# Patient Record
Sex: Female | Born: 1970 | Race: White | Hispanic: No | Marital: Married | Smoking: Current every day smoker
Health system: Southern US, Community
[De-identification: ages and names within clinical notes are randomized; demographics above are authoritative.]

## PROBLEM LIST (undated history)

## (undated) DIAGNOSIS — F988 Other specified behavioral and emotional disorders with onset usually occurring in childhood and adolescence: Secondary | ICD-10-CM

## (undated) HISTORY — DX: Other specified behavioral and emotional disorders with onset usually occurring in childhood and adolescence: F98.8

---

## 2001-02-22 ENCOUNTER — Other Ambulatory Visit: Admission: RE | Admit: 2001-02-22 | Discharge: 2001-02-22 | Payer: Self-pay | Admitting: Obstetrics & Gynecology

## 2004-06-01 ENCOUNTER — Emergency Department (HOSPITAL_COMMUNITY): Admission: EM | Admit: 2004-06-01 | Discharge: 2004-06-01 | Payer: Self-pay | Admitting: Emergency Medicine

## 2005-03-02 ENCOUNTER — Ambulatory Visit (HOSPITAL_COMMUNITY): Admission: RE | Admit: 2005-03-02 | Discharge: 2005-03-02 | Payer: Self-pay | Admitting: *Deleted

## 2005-03-22 ENCOUNTER — Ambulatory Visit: Payer: Self-pay | Admitting: *Deleted

## 2005-03-22 ENCOUNTER — Ambulatory Visit (HOSPITAL_COMMUNITY): Admission: RE | Admit: 2005-03-22 | Discharge: 2005-03-22 | Payer: Self-pay | Admitting: *Deleted

## 2005-05-17 ENCOUNTER — Ambulatory Visit (HOSPITAL_COMMUNITY): Admission: RE | Admit: 2005-05-17 | Discharge: 2005-05-17 | Payer: Self-pay | Admitting: *Deleted

## 2005-07-20 ENCOUNTER — Ambulatory Visit (HOSPITAL_COMMUNITY): Admission: RE | Admit: 2005-07-20 | Discharge: 2005-07-20 | Payer: Self-pay | Admitting: *Deleted

## 2005-08-10 ENCOUNTER — Ambulatory Visit (HOSPITAL_COMMUNITY): Admission: RE | Admit: 2005-08-10 | Discharge: 2005-08-10 | Payer: Self-pay | Admitting: *Deleted

## 2005-09-01 ENCOUNTER — Ambulatory Visit: Payer: Self-pay | Admitting: *Deleted

## 2005-09-05 ENCOUNTER — Ambulatory Visit (HOSPITAL_COMMUNITY): Admission: RE | Admit: 2005-09-05 | Discharge: 2005-09-05 | Payer: Self-pay | Admitting: *Deleted

## 2005-09-06 ENCOUNTER — Ambulatory Visit: Payer: Self-pay | Admitting: Obstetrics and Gynecology

## 2005-09-06 ENCOUNTER — Inpatient Hospital Stay (HOSPITAL_COMMUNITY): Admission: AD | Admit: 2005-09-06 | Discharge: 2005-09-06 | Payer: Self-pay | Admitting: Obstetrics & Gynecology

## 2005-09-07 ENCOUNTER — Inpatient Hospital Stay (HOSPITAL_COMMUNITY): Admission: AD | Admit: 2005-09-07 | Discharge: 2005-09-09 | Payer: Self-pay | Admitting: Obstetrics & Gynecology

## 2005-09-07 ENCOUNTER — Ambulatory Visit: Payer: Self-pay | Admitting: Obstetrics and Gynecology

## 2005-09-07 ENCOUNTER — Encounter (INDEPENDENT_AMBULATORY_CARE_PROVIDER_SITE_OTHER): Payer: Self-pay | Admitting: Specialist

## 2006-09-07 ENCOUNTER — Ambulatory Visit (HOSPITAL_COMMUNITY): Admission: RE | Admit: 2006-09-07 | Discharge: 2006-09-07 | Payer: Self-pay | Admitting: Obstetrics & Gynecology

## 2006-11-22 ENCOUNTER — Ambulatory Visit: Payer: Self-pay | Admitting: Family Medicine

## 2006-11-22 ENCOUNTER — Inpatient Hospital Stay (HOSPITAL_COMMUNITY): Admission: AD | Admit: 2006-11-22 | Discharge: 2006-11-22 | Payer: Self-pay | Admitting: Obstetrics & Gynecology

## 2006-12-05 ENCOUNTER — Ambulatory Visit (HOSPITAL_COMMUNITY): Admission: RE | Admit: 2006-12-05 | Discharge: 2006-12-05 | Payer: Self-pay | Admitting: Gynecology

## 2007-02-06 ENCOUNTER — Inpatient Hospital Stay (HOSPITAL_COMMUNITY): Admission: RE | Admit: 2007-02-06 | Discharge: 2007-02-06 | Payer: Self-pay | Admitting: Obstetrics & Gynecology

## 2007-02-06 ENCOUNTER — Ambulatory Visit: Payer: Self-pay | Admitting: Obstetrics and Gynecology

## 2007-02-13 ENCOUNTER — Ambulatory Visit: Payer: Self-pay | Admitting: Obstetrics and Gynecology

## 2007-02-16 IMAGING — US US OB COMP LESS 14 WK
1 series · 18 of 28 positions shown · non-contrast
Comparison: none

CLINICAL DATA: Dating.  
 EARLY OBSTETRICAL ULTRASOUND:  
 Multiple images of the uterus and adnexa were obtained using a transabdominal approach.
 There is a single intrauterine pregnancy identified that demonstrates an estimated gestational age by ultrasound of 14 weeks and 4 days.  This is 3 weeks and 4 days ahead of expected estimated gestational age by LMP.  Fetal parameters correlate well with composite estimated gestational age today.  No signs of subchorionic hemorrhage are noted. An anterior placentation site is identified.  Limited anatomic evaluation was possible due to the early gestational age.  The following normal anatomy could be seen:  Symmetric choroid plexus, thalamus, stomach, bladder, and profile.  A full anatomic evaluation would be recommended in four weeks.  
 The right ovary was visualized measuring 3.1 x 2.5 x 1.9 cm and containing a corpus luteum cyst.  The left ovary could not be seen with confidence.  No signs of pelvic fluid are noted.

[Series 1: us ob comp less 14 wks · 18 of 46 slices shown]
[im 1/46]
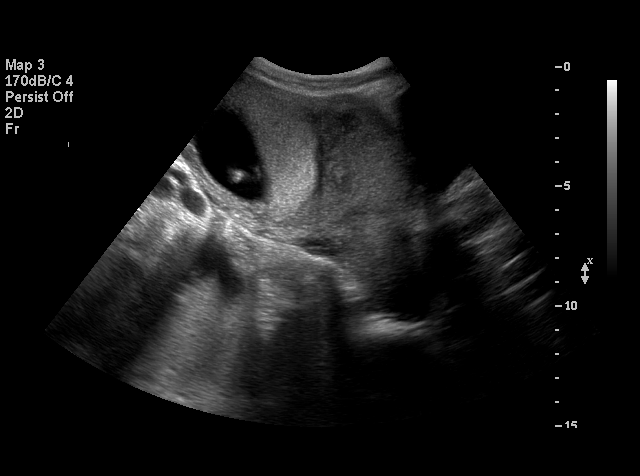
[im 4/46]
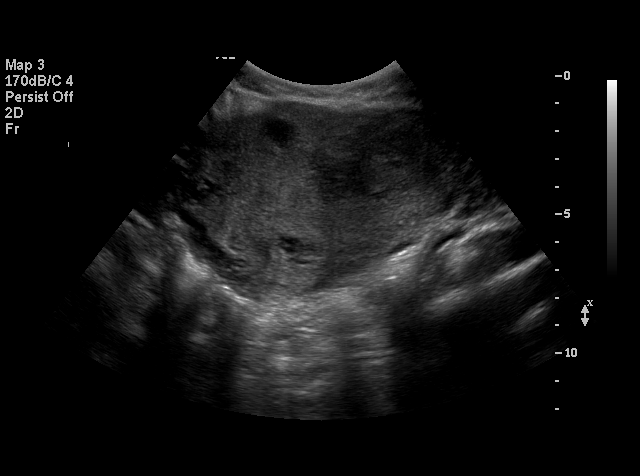
[im 6/46]
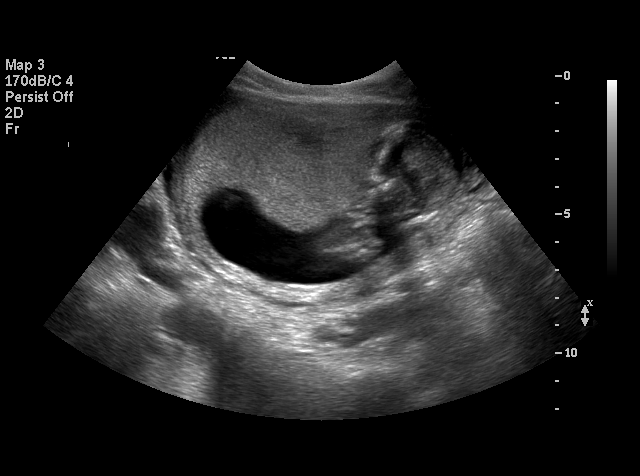
[im 9/46]
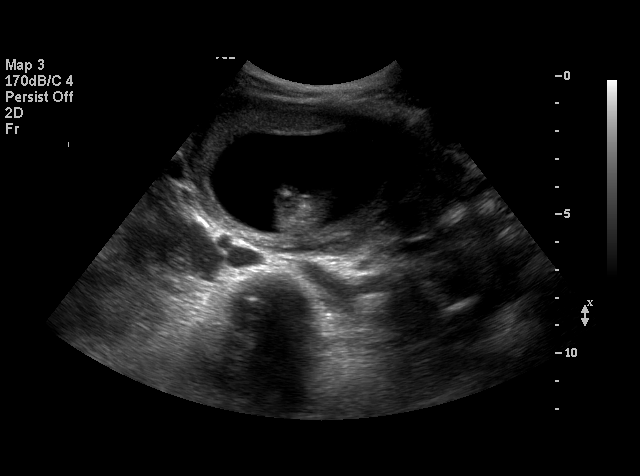
[im 12/46]
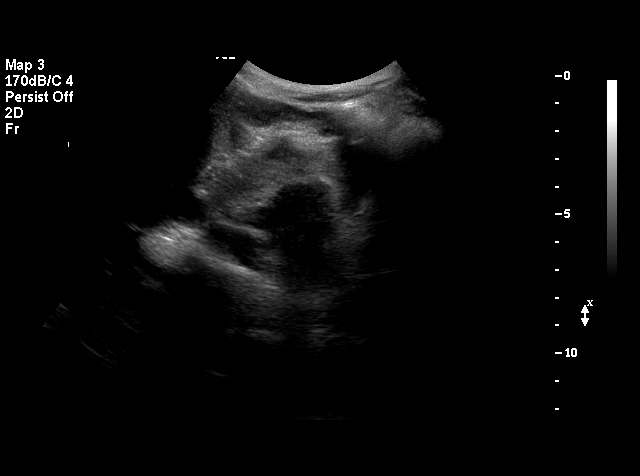
[im 14/46]
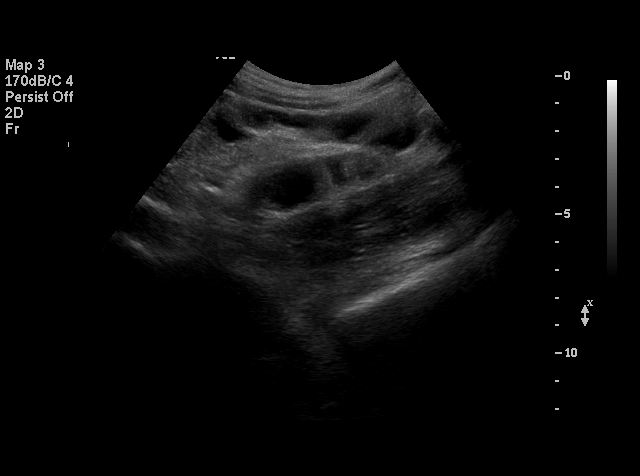
[im 17/46]
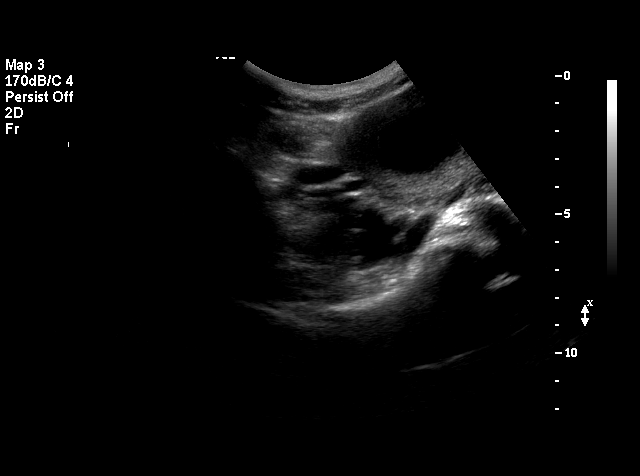
[im 19/46]
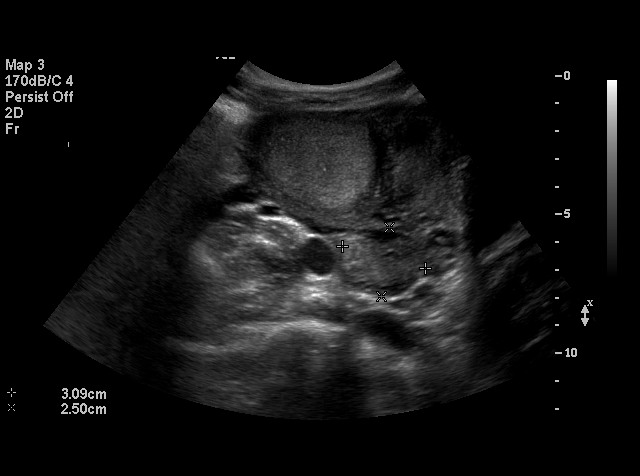
[im 22/46]
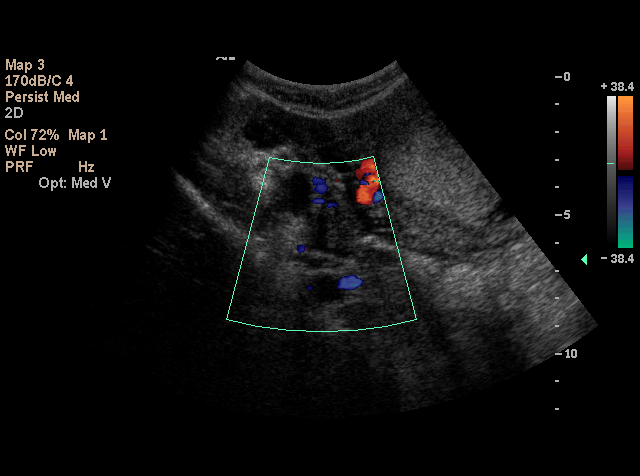
[im 24/46]
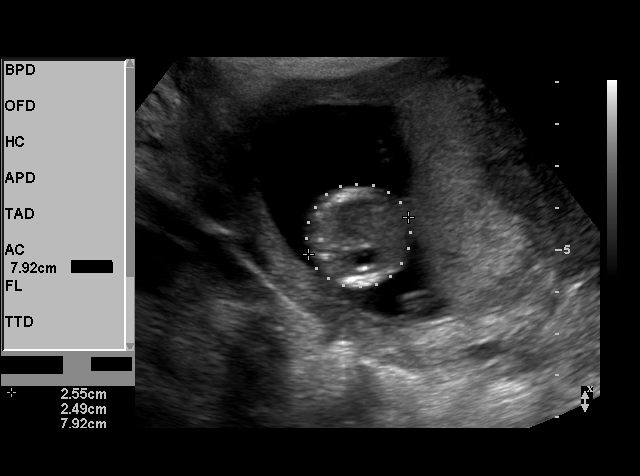
[im 27/46]
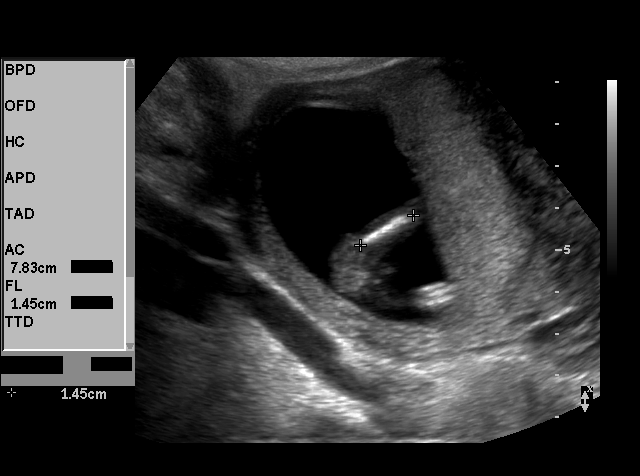
[im 29/46]
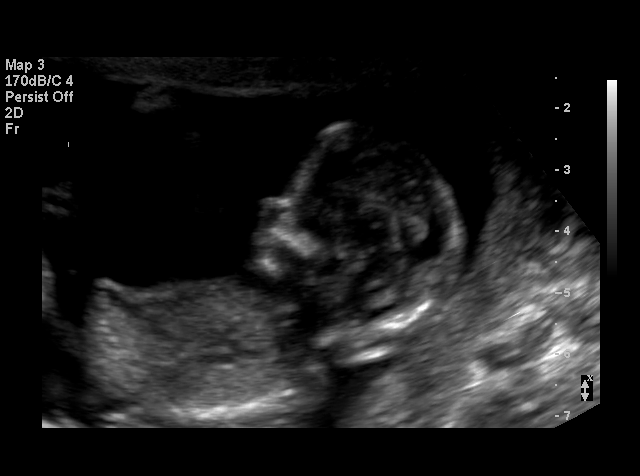
[im 32/46]
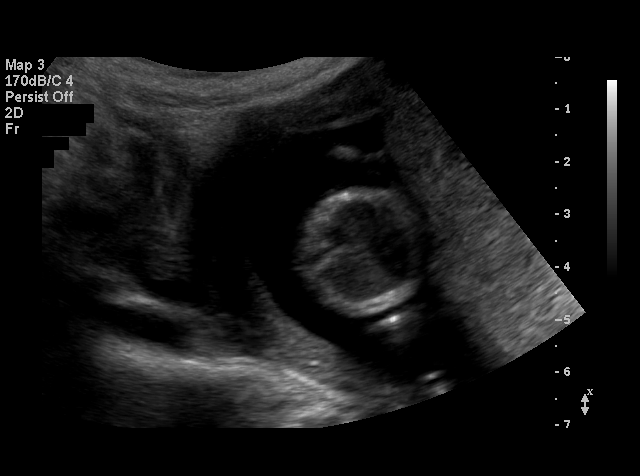
[im 36/46]
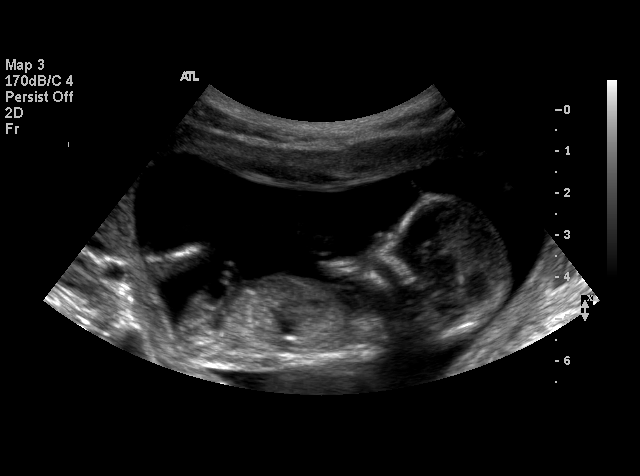
[im 37/46]
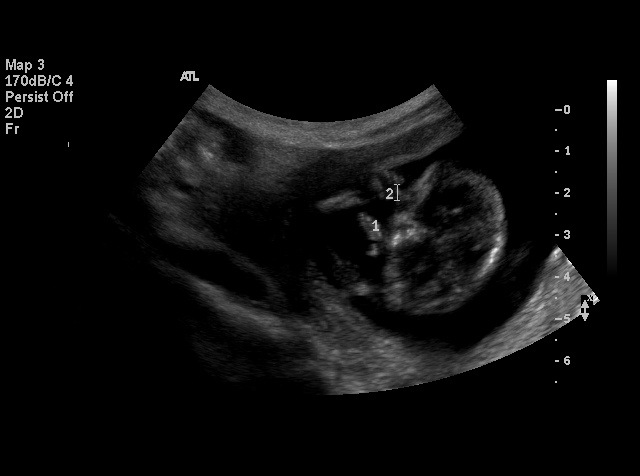
[im 41/46]
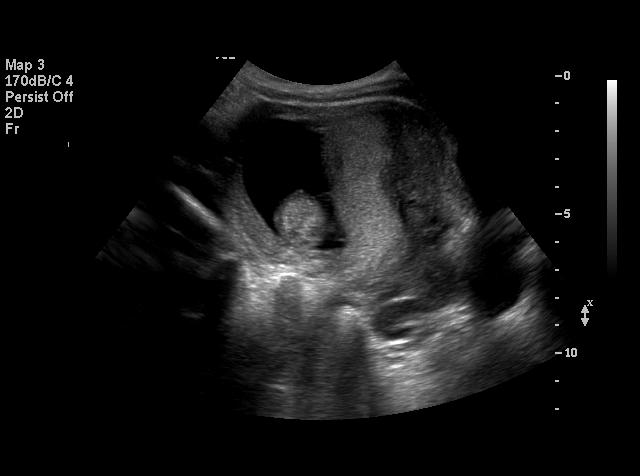
[im 42/46]
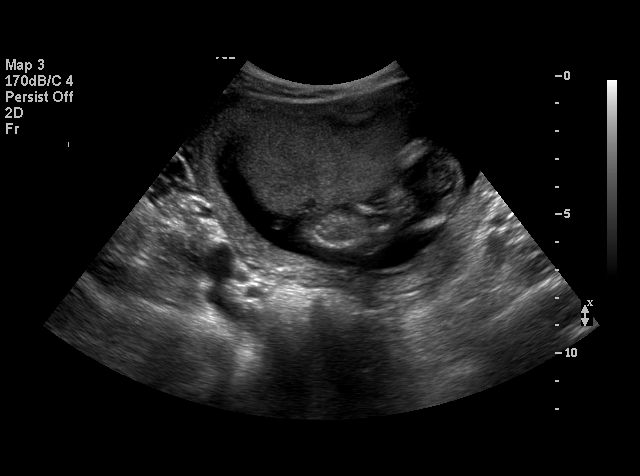
[im 46/46]
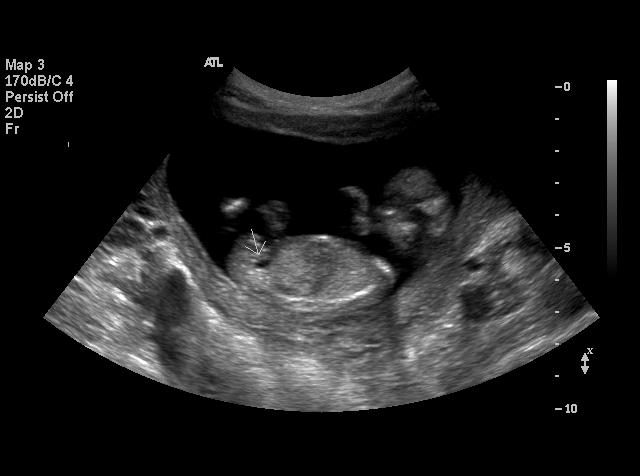

[18 of 28 positions shown; findings below may reference images not displayed]

IMPRESSION: 14 week 4 day living intrauterine pregnancy.

## 2007-02-18 ENCOUNTER — Ambulatory Visit: Payer: Self-pay | Admitting: Family Medicine

## 2007-02-18 ENCOUNTER — Inpatient Hospital Stay (HOSPITAL_COMMUNITY): Admission: AD | Admit: 2007-02-18 | Discharge: 2007-02-21 | Payer: Self-pay | Admitting: Obstetrics and Gynecology

## 2008-08-23 IMAGING — US US OB DETAIL+14 WK
1 series · 13 of 28 positions shown · non-contrast
Comparison: none

CLINICAL DATA: Assess dates and fetal anatomy.  Advanced maternal age.

[Series 1: us ob detail+14 wk · 0.18mm/px · 13 of 107 slices shown]
[im 4/107]
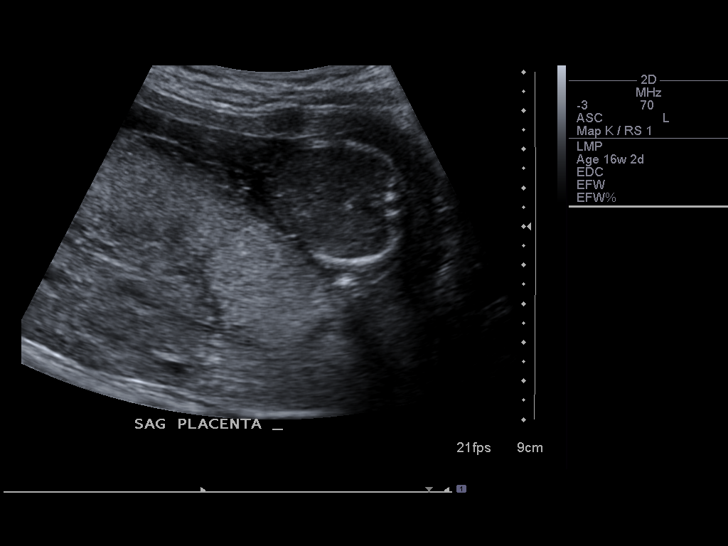
[im 12/107]
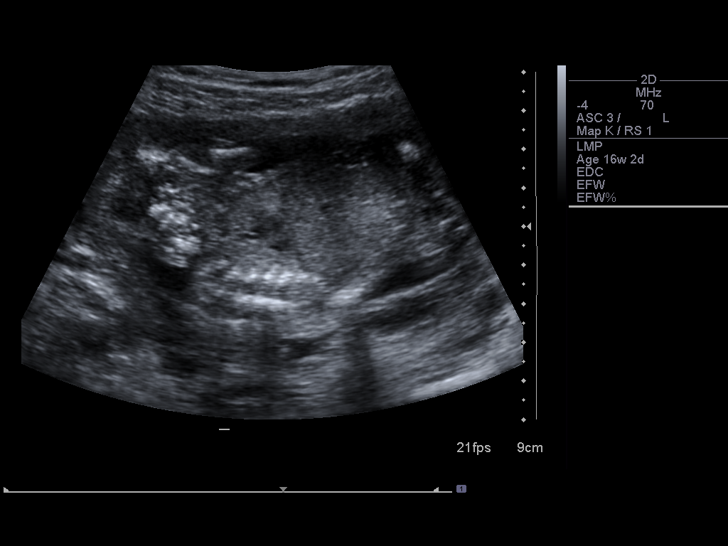
[im 20/107]
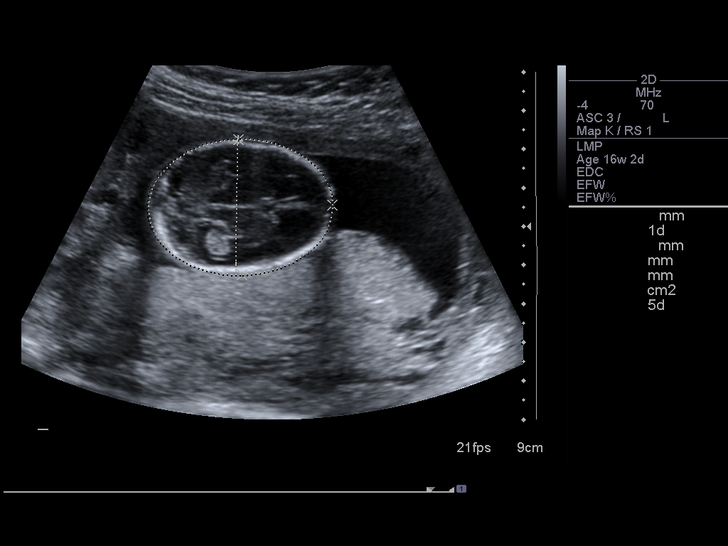
[im 28/107]
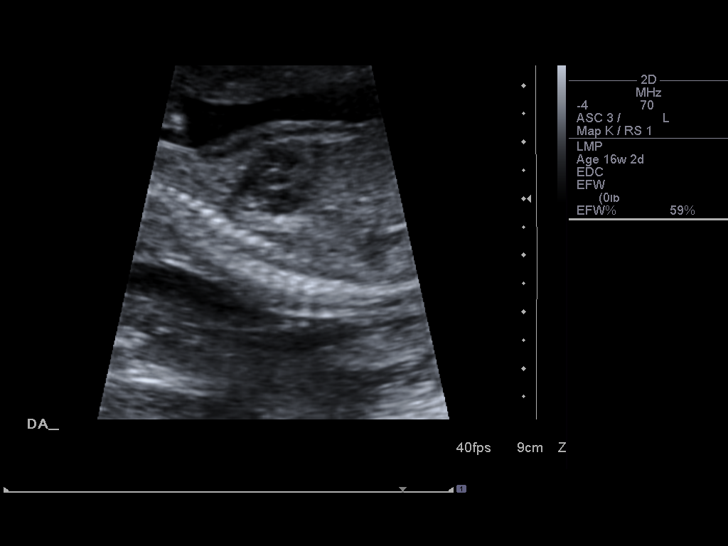
[im 36/107]
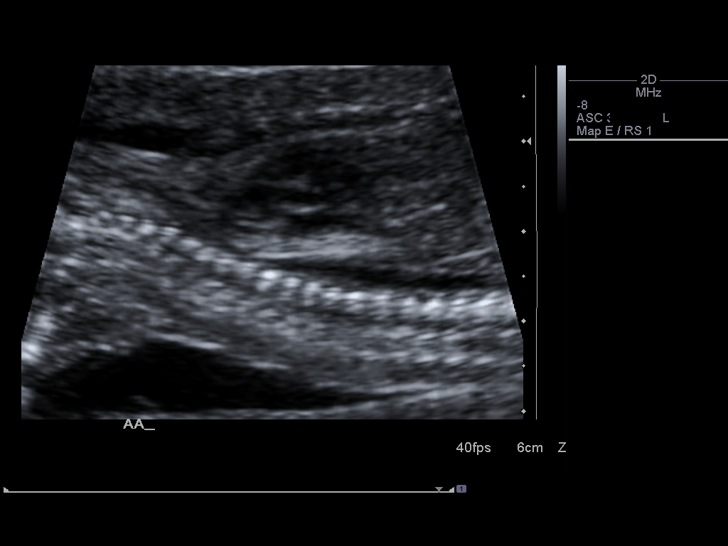
[im 44/107]
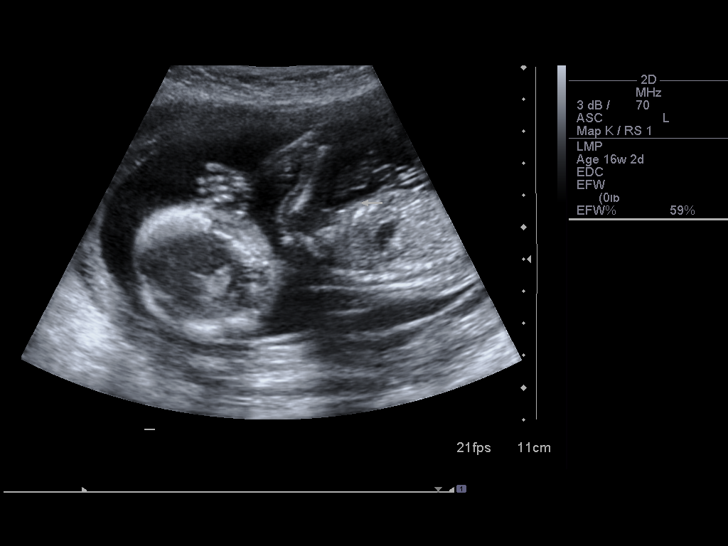
[im 55/107]
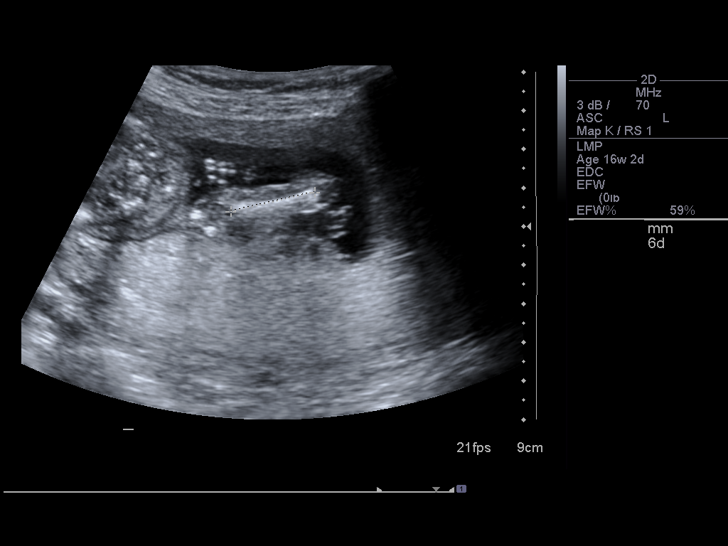
[im 63/107]
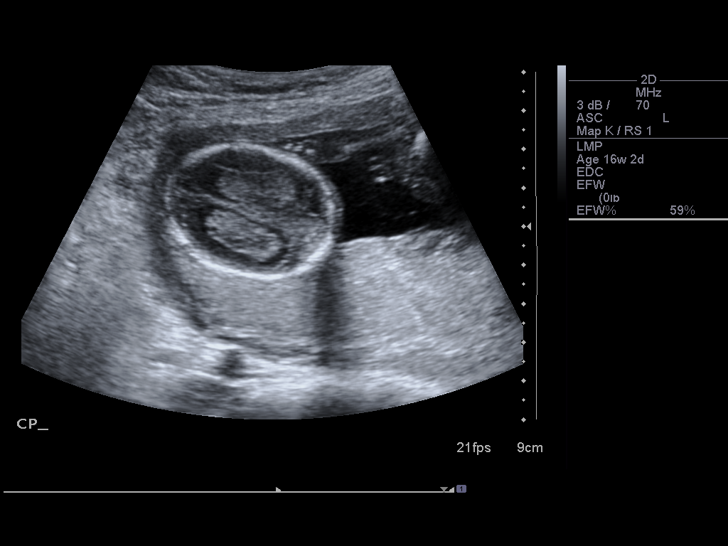
[im 71/107]
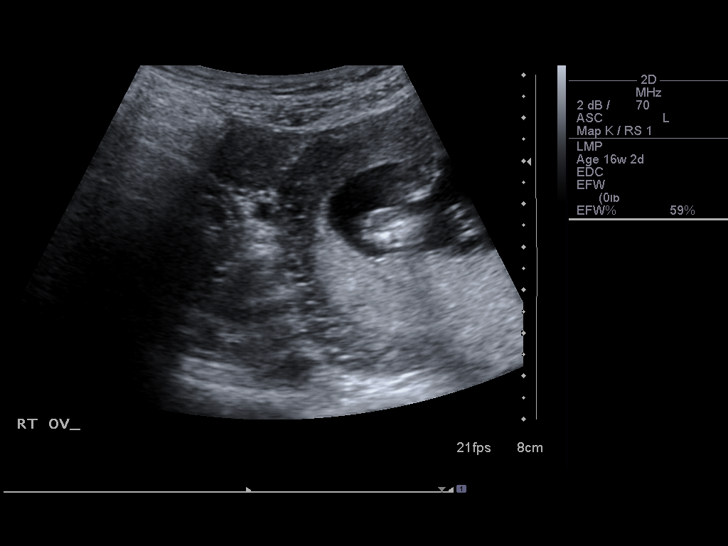
[im 79/107]
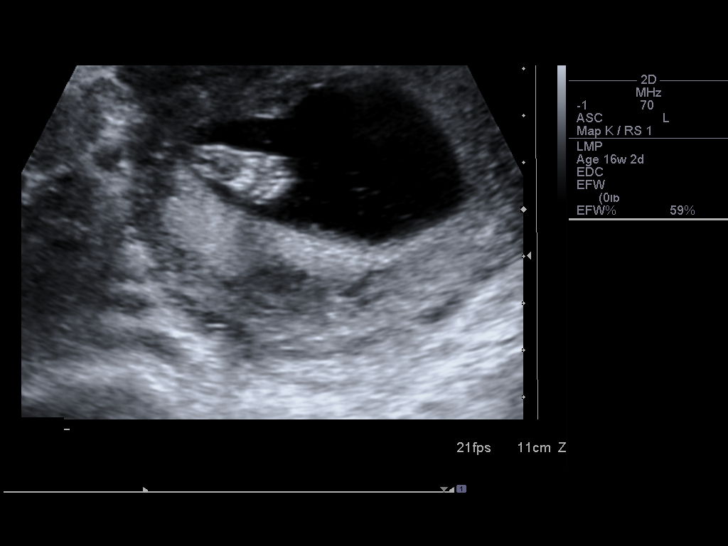
[im 87/107]
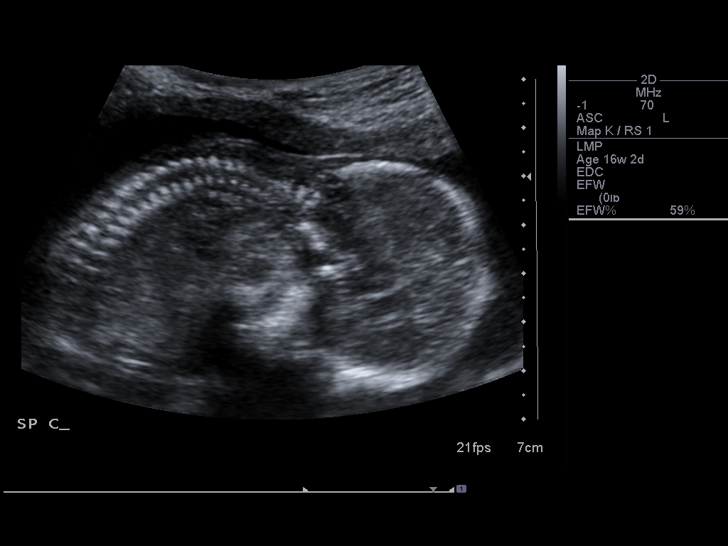
[im 95/107]
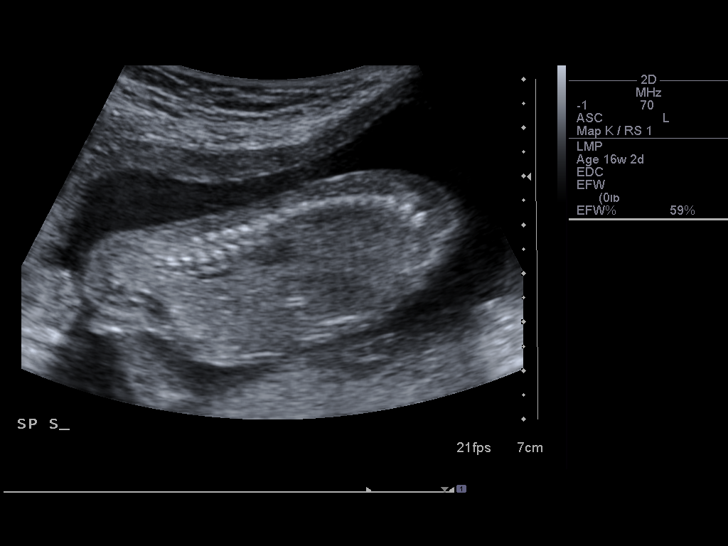
[im 103/107]
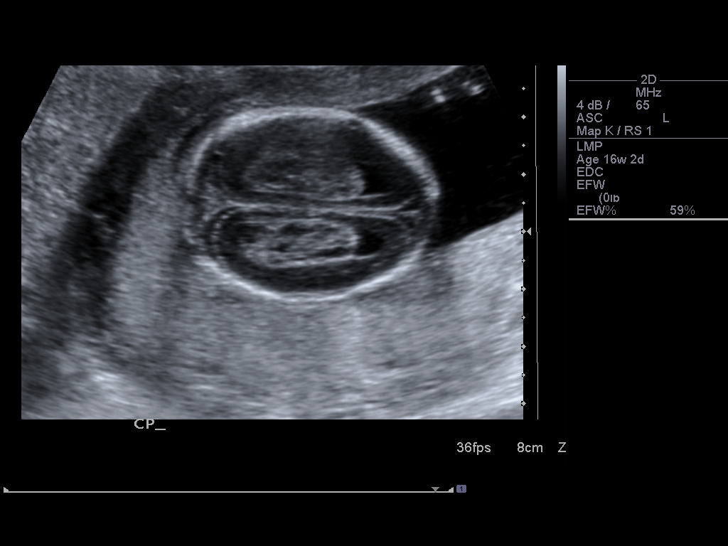

[13 of 28 positions shown; findings below may reference images not displayed]

DETAILED OBSTETRICAL ULTRASOUND:

Number of Fetuses:  1
Heart Rate:  144 bpm
Movement:  Yes
Breathing:  No
Presentation:  Transverse
Placental Location:  Posterior, low lying
Grade:  I
Previa:  No
Amniotic Fluid (Subjective):  Normal
Amniotic Fluid (Objective):  3.6 cm vertical pocket 

FETAL BIOMETRY
BPD:  3.3 cm   16 w 2 d 
HC:  13.0 cm   16 w 4 d 
AC:  11.2 cm   17 w 0 d 
FL:  2.0 cm   16 w 0 d 
HL:  2.2 cm   16 w 6 d 

MEAN GA:  16 w 4 d   US EDC:  02/18/07

FETAL ANATOMY
Lateral Ventricles:  Visualized 
Thalami/CSP:  Visualized 
Posterior Fossa:  Visualized 
Nuchal Region:  NF= 3.3 mm  Visualized 
Spine:  Visualized 
4 Chamber Heart on Left:  Visualized 
Stomach on Left:  Visualized 
3 Vessel Cord:  Visualized 
Cord Insertion site:  Visualized 
Kidneys:  Visualized 
Bladder:  Visualized 
Extremities:  Visualized 

ADDITIONAL ANATOMY VISUALIZED:  LVOT, RVOT, upper lip, orbits, profile, diaphragm, heel, 5th digit, ductal arch, and aortic arch.

MATERNAL UTERINE AND ADNEXAL FINDINGS
Cervix:  3.7 cm transabdominal.
IMPRESSION: 1.  Single intrauterine pregnancy demonstrating an estimated gestational age by ultrasound of 16 weeks 4 days.  Correlation with assigned gestational age by LMP of 16 weeks 2 days corresponds with appropriate growth.  
2.  No focal fetal abnormalities are noted with a good anatomic exam possible.   Specifically, no soft markers for Down syndrome were identified.  Given the patient?s age at birth of 35 and a pre-ultrasound odds-risk ratio of [DATE], today?s normal ultrasound would decrease the patient?s odds-risk ratio for Down syndrome to [DATE].  The patient states that she opted not to have the triple screen test performed.  
3.  Today?s scan results were discussed with the patient.

## 2008-11-20 IMAGING — US US OB LIMITED
1 series · 14 of 27 positions shown · non-contrast
Comparison: none

OBSTETRICAL ULTRASOUND:

 This ultrasound exam was performed in the [HOSPITAL] Ultrasound Department.  The OB US report was generated in the AS system, and faxed to the ordering physician.  This report is also available in [REDACTED] PACS.

[Series 1: us ob limited · 0.30mm/px · 14 of 27 slices shown]
[im 1/27]
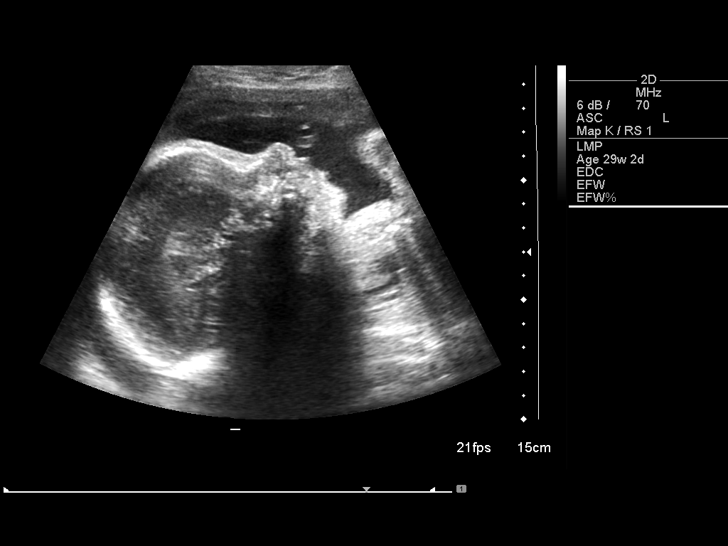
[im 3/27]
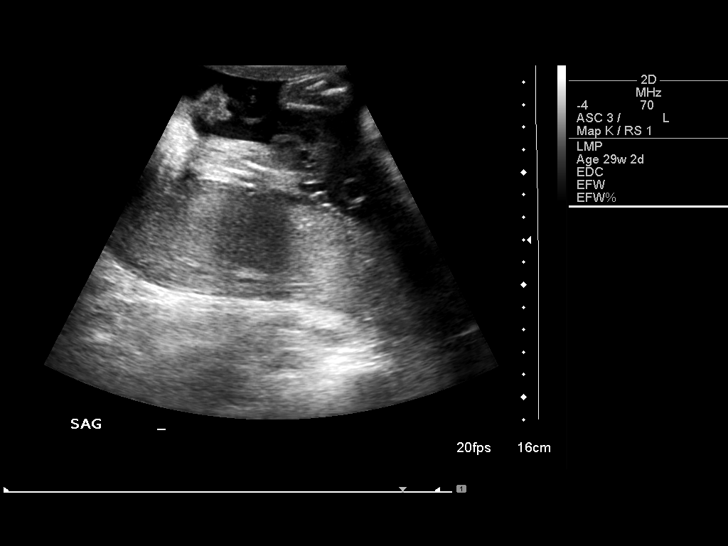
[im 5/27]
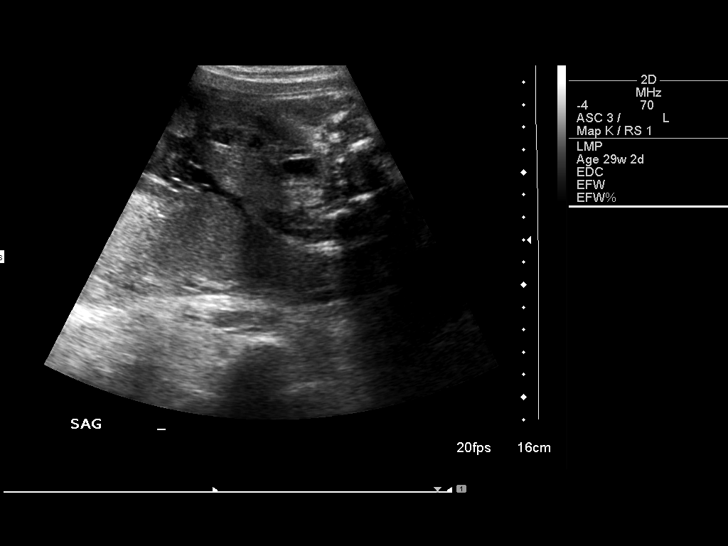
[im 7/27]
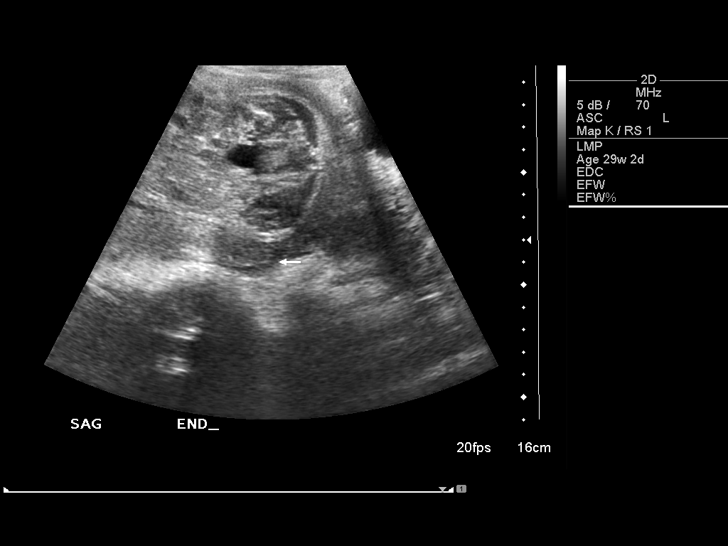
[im 9/27]
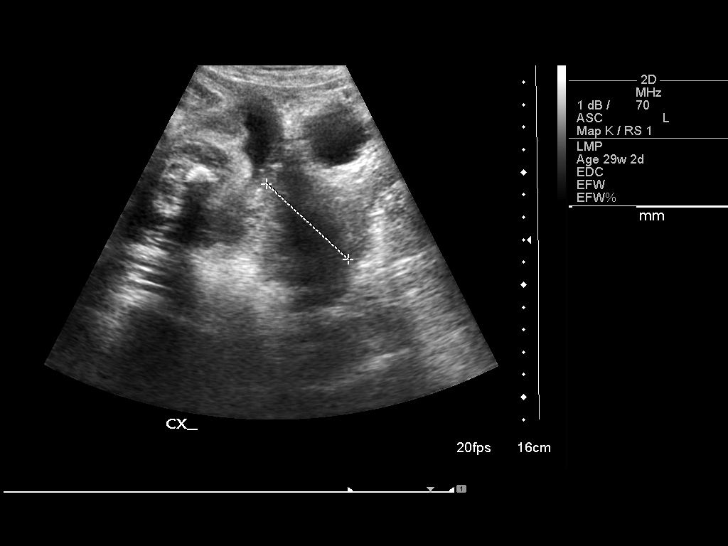
[im 11/27]
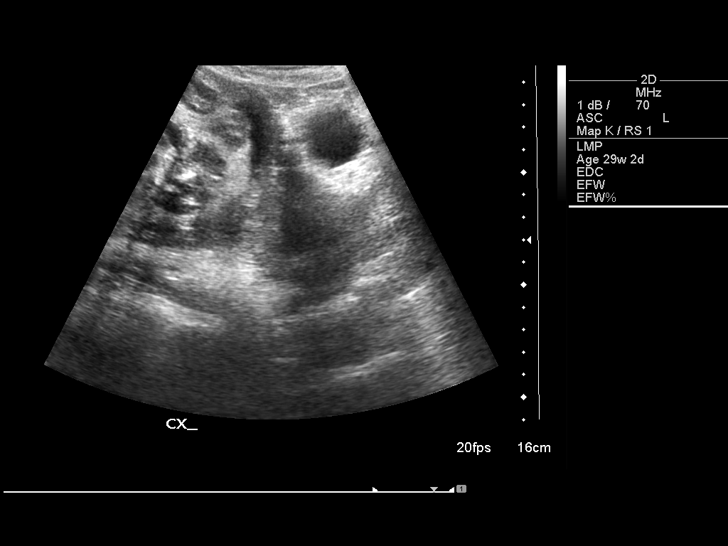
[im 13/27]
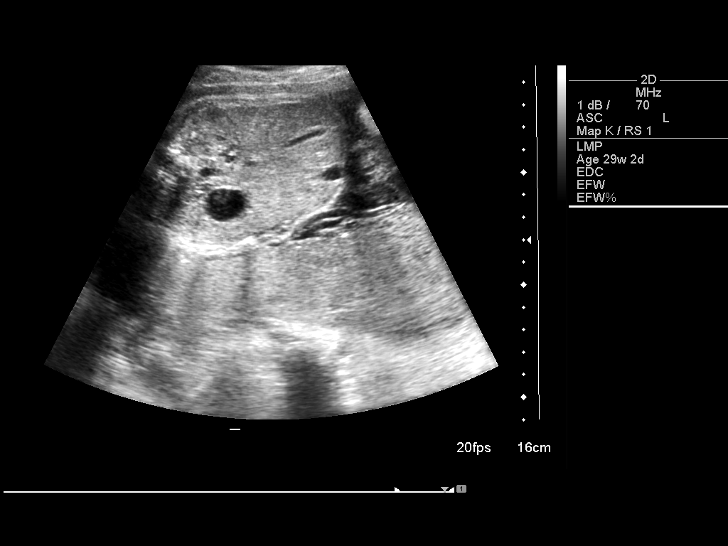
[im 15/27]
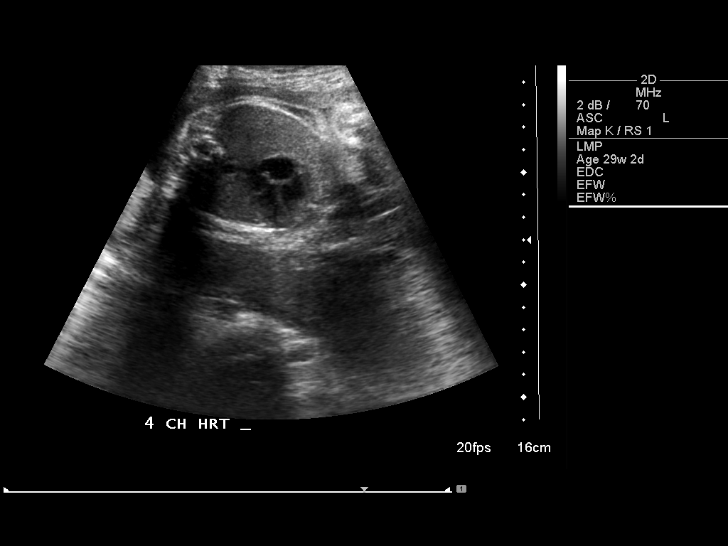
[im 17/27]
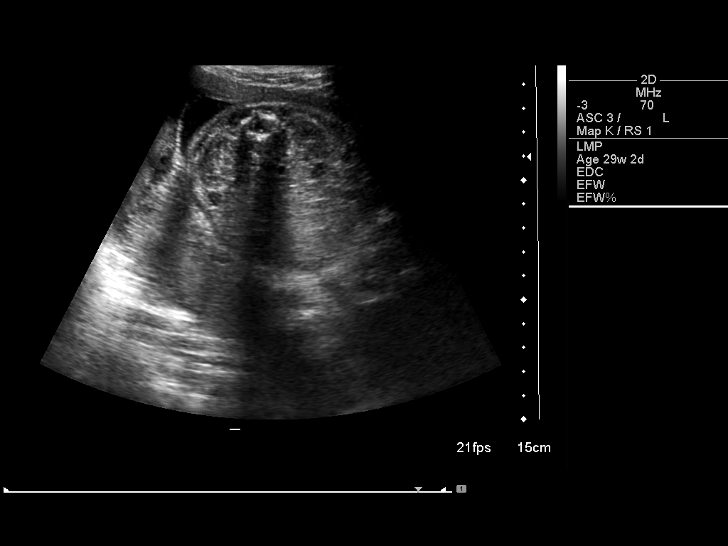
[im 19/27]
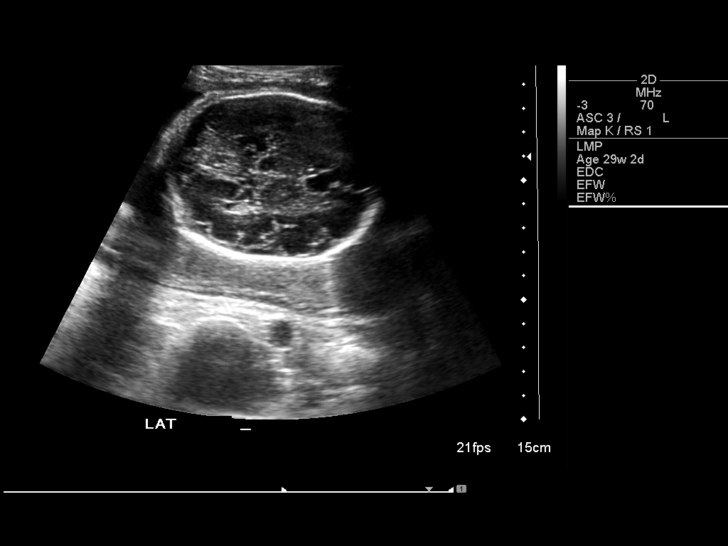
[im 21/27]
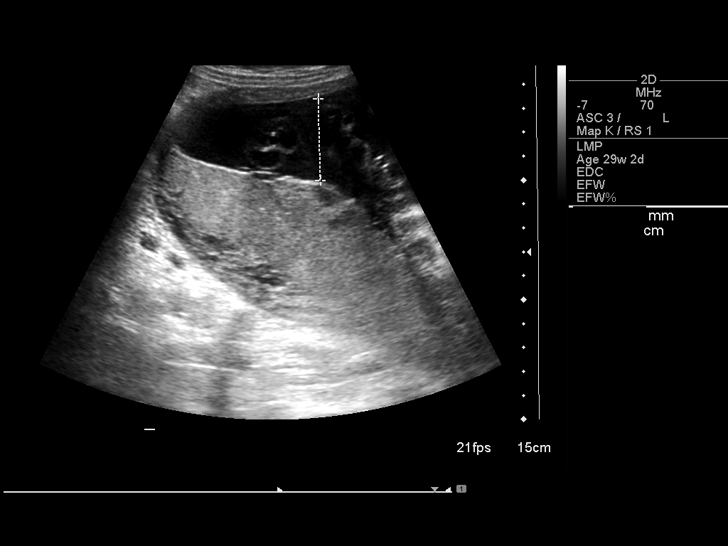
[im 23/27]
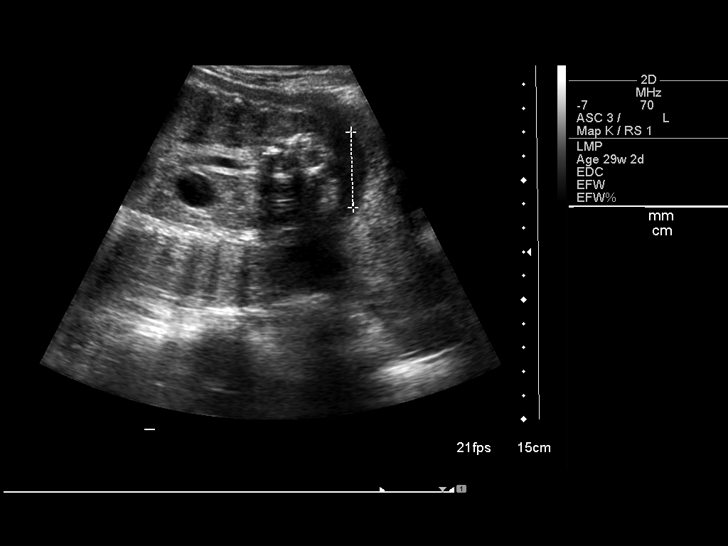
[im 25/27]
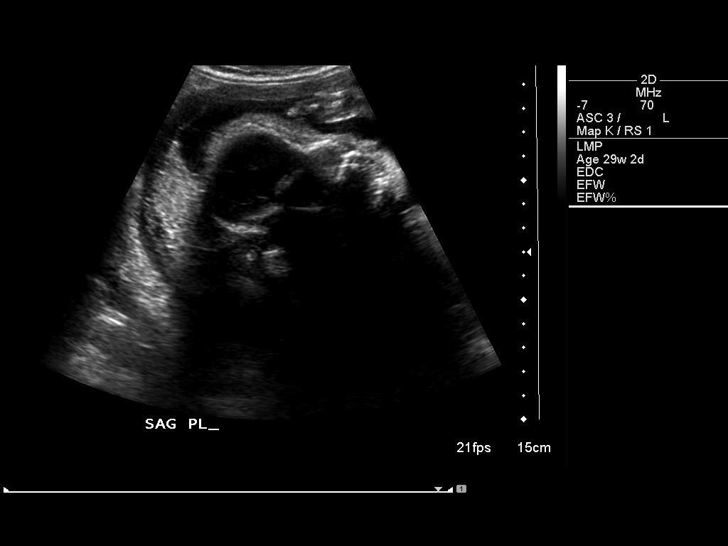
[im 27/27]
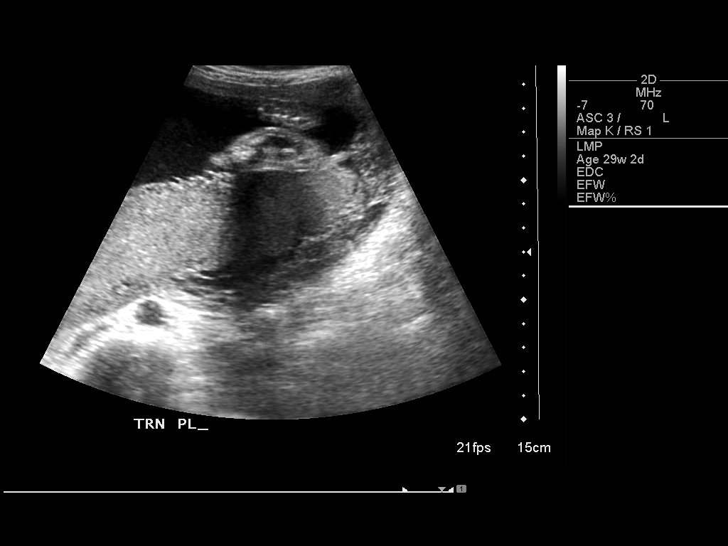

[14 of 27 positions shown; findings below may reference images not displayed]

IMPRESSION: See AS Obstetric US report.

## 2010-09-08 ENCOUNTER — Other Ambulatory Visit
Admission: RE | Admit: 2010-09-08 | Discharge: 2010-09-08 | Payer: Self-pay | Source: Home / Self Care | Admitting: Family Medicine

## 2010-09-08 ENCOUNTER — Ambulatory Visit: Payer: Self-pay | Admitting: Family Medicine

## 2010-09-08 DIAGNOSIS — F329 Major depressive disorder, single episode, unspecified: Secondary | ICD-10-CM

## 2010-09-08 DIAGNOSIS — F411 Generalized anxiety disorder: Secondary | ICD-10-CM | POA: Insufficient documentation

## 2010-09-08 LAB — CONVERTED CEMR LAB: Pap Smear: NEGATIVE

## 2010-09-10 ENCOUNTER — Telehealth: Payer: Self-pay | Admitting: Family Medicine

## 2010-09-24 ENCOUNTER — Ambulatory Visit: Payer: Self-pay | Admitting: Family Medicine

## 2010-09-24 DIAGNOSIS — F988 Other specified behavioral and emotional disorders with onset usually occurring in childhood and adolescence: Secondary | ICD-10-CM

## 2010-09-24 DIAGNOSIS — F17200 Nicotine dependence, unspecified, uncomplicated: Secondary | ICD-10-CM

## 2010-11-02 NOTE — Assessment & Plan Note (Signed)
Summary: BRAND NEW PT/TO REQ/REQ CPX/PT COMING IN FASTING/SELF PAY/CJR   Vital Signs:  Patient profile:   40 year old female Height:      64 inches Weight:      116.5 pounds BMI:     20.07 O2 Sat:      97 % on Room air Temp:     98.1 degrees F oral Pulse rate:   90 / minute BP sitting:   120 / 78  (left arm) Cuff size:   regular  Vitals Entered By: Kathlene November LPN (September 08, 2010 10:37 AM)  O2 Flow:  Room air CC: has anxiety and depression- gets angry easy, no motivation- has tried Trazadone and Zoloft 50mg  twice a day  Is Patient Diabetic? No   CC:  has anxiety and depression- gets angry easy and no motivation- has tried Trazadone and Zoloft 50mg  twice a day .  History of Present Illness: Brenda Lyons is a 40 year old female......Marland Kitchen married for the second time............ G3 P3 children are 47 year old and 67-year-old boy and a 25 1/2-year-old girl............ smoker 10 cigarettes a day..........., who comes in today as a new patient for evaluation of multiple issues.  She has a fairly long history of anxiety and depression of the last 10 to 15 years.  She's been going to [facility].  She has  been given Wellbutrin, Zoloft, and trazodone, none of which really helped.  She is originally from the [place], [place] area.  She did well in high school, but did not do well in community college.  She tended to bounce around.  She has some symptoms consistent with adult ADD.her mother and son also have been diagnosed with ADD  Penicillin Wellbutrin gave her a rash.  She drinks a beer every night after work........ 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  She's also had a skin rash, etiology unknown.  Her last dermatologist, Dr. Londell Moh, put on Aldactone 50 mg b.i.d.  She also has migraine headaches, which she's  aborts by getting in a quiet dark room and taking some Motrin.  She's never had a mammogram.  Her mother was diagnosed to have breast cancer in her  early 67s.  Tetanus today.   2011, which are today 2011  As noted above.  She is a G3, P3.  She does not have any periods because she has a Engineer, civil (consulting) IUD. .....was put in by the health department 3 1/2 years ago.  She smokes 10 cigarettes a day, and would like to quit.  In the, past.  She's tried cold [place], but it doesn't last long.  Her husband also smokes  Preventive Screening-Counseling & Management  Alcohol-Tobacco     Alcohol drinks/day: 1     Alcohol type: beer     Smoking Status: current     Packs/Day: 0.5  Caffeine-Diet-Exercise     Caffeine use/day: 4     Does Patient Exercise: yes     Times/week: 3      Drug Use:  no.    Current Medications (verified): 1)  Mirena 20 Mcg/24hr Iud (Levonorgestrel) 2)  Spironolactone 50 Mg Tabs (Spironolactone) .... Take One Tablet By Mouth Once A Day 3)  Multivitamins  Tabs (Multiple Vitamin) .... Take One Tablet By Mouth Once A Day  Allergies (verified): 1)  Pcn 2)  Wellbutrin  Past History:  Past medical, surgical, family and social histories (including risk factors) reviewed, and no changes noted (except as noted below).  Past Medical History: childbirth x  3 Anxiety Depression  Family History: Reviewed history and no changes required. father 90, alive and well mother 25.  History breast cancer in her early 65s had surgery.  Postoperatively,  doing well.  No recurrence.  She does not know anything about her maternal grandmother.  One brother one sister both in good health  Social History: Reviewed history and no changes required. Married Current Smoker Alcohol use-yes Drug use-no Regular exercise-yes Smoking Status:  current Packs/Day:  0.5 Caffeine use/day:  4 Does Patient Exercise:  yes Drug Use:  no  Review of Systems      See HPI  Physical Exam  General:  Well-developed,well-nourished,in no acute distress; alert,appropriate and cooperative throughout examination Head:  Normocephalic and atraumatic without obvious abnormalities. No  apparent alopecia or balding. Eyes:  No corneal or conjunctival inflammation noted. EOMI. Perrla. Funduscopic exam benign, without hemorrhages, exudates or papilledema. Vision grossly normal. Ears:  External ear exam shows no significant lesions or deformities.  Otoscopic examination reveals clear canals, tympanic membranes are intact bilaterally without bulging, retraction, inflammation or discharge. Hearing is grossly normal bilaterally. Nose:  External nasal examination shows no deformity or inflammation. Nasal mucosa are pink and moist without lesions or exudates. Mouth:  Oral mucosa and oropharynx without lesions or exudates.  Teeth in good repair. Neck:  No deformities, masses, or tenderness noted. Chest Wall:  No deformities, masses, or tenderness noted. Breasts:  No mass, nodules, thickening, tenderness, bulging, retraction, inflamation, nipple discharge or skin changes noted.   Lungs:  Normal respiratory effort, chest expands symmetrically. Lungs are clear to auscultation, no crackles or wheezes. Heart:  Normal rate and regular rhythm. S1 and S2 normal without gallop, murmur, click, rub or other extra sounds. Abdomen:  Bowel sounds positive,abdomen soft and non-tender without masses, organomegaly or hernias noted. Genitalia:  Pelvic Exam:        External: normal female genitalia without lesions or masses        Vagina: normal without lesions or masses        Cervix: normal without lesions or masses        Adnexa: normal bimanual exam without masses or fullness        Uterus: normal by palpation        Pap smear: not performed Msk:  No deformity or scoliosis noted of thoracic or lumbar spine.   Pulses:  R and L carotid,radial,femoral,dorsalis pedis and posterior tibial pulses are full and equal bilaterally Extremities:  No clubbing, cyanosis, edema, or deformity noted with normal full range of motion of all joints.   Neurologic:  No cranial nerve deficits noted. Station and gait are  normal. Plantar reflexes are down-going bilaterally. DTRs are symmetrical throughout. Sensory, motor and coordinative functions appear intact. Skin:  total body skin exam normal except for multiple postures or lesions on her back, face, arms, and legs Cervical Nodes:  No lymphadenopathy noted Axillary Nodes:  No palpable lymphadenopathy Inguinal Nodes:  No significant adenopathy Psych:  Cognition and judgment appear intact. Alert and cooperative with normal attention span and concentration. No apparent delusions, illusions, hallucinations   Problems:  Medical Problems Added: 1)  Dx of Depression  (ICD-311) 2)  Dx of Anxiety  (ICD-300.00) 3)  Dx of Routine General Medical Exam@health  Care Facl  (ICD-V70.0) 4)  Dx of Depressive Disorder  (ICD-311) 5)  Dx of Rash and Other Nonspecific Skin Eruption  (ICD-782.1)  Impression & Recommendations:  Problem # 1:  Preventive Health Care (ICD-V70.0) Assessment New  Problem #  2:  DEPRESSIVE DISORDER (ICD-311) Assessment: New  Problem # 3:  RASH AND OTHER NONSPECIFIC SKIN ERUPTION (ICD-782.1) Assessment: New  Complete Medication List: 1)  Mirena 20 Mcg/24hr Iud (Levonorgestrel) 2)  Spironolactone 50 Mg Tabs (Spironolactone) .... Take one tablet by mouth once a day 3)  Multivitamins Tabs (Multiple vitamin) .... Take one tablet by mouth once a day 4)  Methylphenidate Hcl 10 Mg Tabs (Methylphenidate hcl) .... Take 1 tablet by mouth two times a day 5)  Chantix Continuing Month Pak 1 Mg Tabs (Varenicline tartrate) .... Uad  Other Orders: Tdap => 58yrs IM (16109) Admin 1st Vaccine (60454) Flu Vaccine 76yrs + (09811) Admin of Any Addtl Vaccine (91478)  Patient Instructions: 1)  Ritalin 10 mg twice daily.  Follow-up in two weeks. 2)  I will call you dermatologist Dr. Terri Piedra to try to find out a diagnosis so we can help heal the skin lesions. 3)  Chantix one half tab q.a.m. and begin tapering..........Marland Kitchen 8 for a week......... 6 for a week.......Marland Kitchen  etc. etc. 4)  Schedule your mammogram. Prescriptions: SPIRONOLACTONE 50 MG TABS (SPIRONOLACTONE) Take one tablet by mouth once a day  #100 x 6   Entered and Authorized by:   Roderick Pee MD   Signed by:   Roderick Pee MD on 09/08/2010   Method used:   Print then Give to Patient   RxID:   [id number] CHANTIX CONTINUING MONTH PAK 1 MG TABS (VARENICLINE TARTRATE) UAD  #1 x 2   Entered and Authorized by:   Roderick Pee MD   Signed by:   Roderick Pee MD on 09/08/2010   Method used:   Print then Give to Patient   RxID:   [id number] METHYLPHENIDATE HCL 10 MG TABS (METHYLPHENIDATE HCL) Take 1 tablet by mouth two times a day  #60 x 0   Entered and Authorized by:   Roderick Pee MD   Signed by:   Roderick Pee MD on 09/08/2010   Method used:   Print then Give to Patient   RxID:   [id number]    Orders Added: 1)  Tdap => 39yrs IM [56387] 2)  Admin 1st Vaccine [90471] 3)  Flu Vaccine 69yrs + [56433] 4)  Admin of Any Addtl Vaccine [90472] 5)  New Patient Level II [29518]   Immunizations Administered:  Tetanus Vaccine:    Vaccine Type: Tdap    Site: left deltoid    Mfr: GlaxoSmithKline    Dose: 0.5 ml    Route: IM    Given by: Kathlene November LPN    Exp. Date: 07/22/2012    Lot #: [id number]    VIS given: 08/20/08 version given September 08, 2010.  Influenza Vaccine # 1:    Vaccine Type: Fluvax 3+    Site: right deltoid    Mfr: GlaxoSmithKline    Dose: 0.5 ml    Route: IM    Given by: Kathlene November LPN    Exp. Date: 04/02/2011    Lot #: [id number]    VIS given: 04/27/10 version given September 08, 2010.   Immunizations Administered:  Tetanus Vaccine:    Vaccine Type: Tdap    Site: left deltoid    Mfr: GlaxoSmithKline    Dose: 0.5 ml    Route: IM    Given by: Kathlene November LPN    Exp. Date: 07/22/2012    Lot #: [id number]    VIS given: 08/20/08 version given September 08, 2010.  Influenza  Vaccine # 1:    Vaccine Type: Fluvax 3+    Site: right  deltoid    Mfr: GlaxoSmithKline    Dose: 0.5 ml    Route: IM    Given by: Kathlene November LPN    Exp. Date: 04/02/2011    Lot #: [id number]    VIS given: 04/27/10 version given September 08, 2010.

## 2010-11-02 NOTE — Progress Notes (Signed)
  Phone Note Outgoing Call   Summary of Call: I called [name]........... Pap was normal.  There was some Candida however, she is asymptomatic.  Advised no treatment since she is asymptomatic, however, if she does become symptomatic.  Advise Monistat 7 Initial call taken by: Roderick Pee MD,  September 10, 2010 9:40 AM

## 2010-11-04 NOTE — Assessment & Plan Note (Signed)
Summary: 2 wk rov/njr   Vital Signs:  Patient profile:   40 year old female Weight:      116 pounds Temp:     98.1 degrees F oral BP sitting:   102 / 78  (left arm) Cuff size:   regular  Vitals Entered By: Kern Reap CMA Duncan Dull) (September 24, 2010 11:23 AM)  History of Present Illness: Brenda Lyons is a 40 year old female, G3, P3, smoker, who comes in today for follow-up of ADD and tobacco abuse.  We gave her a prescription for the chantix, which she never got filled.  I again encouraged her to begin the chantix program.  She is taking her Ritalin 10 mg b.i.d. and has noticed a great improvement in her focus and concentration.  She states her husband, who is a Music therapist has ADHD, and they are having a lot of marital problems.  She went to counseling last year, but he will go with her  We are still unable to get a copy of her dermatologic reports.  However, she states that she had another name when she went to Dr. Dorita Sciara office.  She will try to get those reports for [place]  Allergies: 1)  Pcn 2)  Wellbutrin  Past History:  Past medical, surgical, family and social histories (including risk factors) reviewed for relevance to current acute and chronic problems.  Past Medical History: Reviewed history from 09/08/2010 and no changes required. childbirth x 3 Anxiety Depression  Family History: Reviewed history from 09/08/2010 and no changes required. father 6, alive and well mother 19.  History breast cancer in her early 83s had surgery.  Postoperatively,  doing well.  No recurrence.  She does not know anything about her maternal grandmother.  One brother one sister both in good health  Social History: Reviewed history from 09/08/2010 and no changes required. Married Current Smoker Alcohol use-yes Drug use-no Regular exercise-yes  Review of Systems      See HPI  Physical Exam  General:  Well-developed,well-nourished,in no acute distress; alert,appropriate and  cooperative throughout examination   Problems:  Medical Problems Added: 1)  Dx of Tobacco Use  (ICD-305.1) 2)  Dx of Attention Deficit Disorder, Adult  (ICD-314.00)  Impression & Recommendations:  Problem # 1:  ATTENTION DEFICIT DISORDER, ADULT (ICD-314.00) Assessment Improved  Problem # 2:  RASH AND OTHER NONSPECIFIC SKIN ERUPTION (ICD-782.1) Assessment: Unchanged  Problem # 3:  TOBACCO USE (ICD-305.1) Assessment: Unchanged  Her updated medication list for this problem includes:    Chantix Continuing Month Pak 1 Mg Tabs (Varenicline tartrate) ..... Uad  Complete Medication List: 1)  Mirena 20 Mcg/24hr Iud (Levonorgestrel) 2)  Spironolactone 50 Mg Tabs (Spironolactone) .... Take one tablet by mouth once a day 3)  Multivitamins Tabs (Multiple vitamin) .... Take one tablet by mouth once a day 4)  Methylphenidate Hcl 10 Mg Tabs (Methylphenidate hcl) .... Take 1 tablet by mouth 3  times a day 5)  Chantix Continuing Month Pak 1 Mg Tabs (Varenicline tartrate) .... Uad  Patient Instructions: 1)  seriously consider trying the chantix.......... one half tab q.a.m. and taper the cigarettes as was outlined. 2)  Increase the Ritalin to 10 mg 3 times a day. 3)  Have Dr. Dorita Sciara office fax me a copy of all the dermatologic data and I will call you after the new year to discuss those findings. Prescriptions: METHYLPHENIDATE HCL 10 MG TABS (METHYLPHENIDATE HCL) Take 1 tablet by mouth 3  times a day  #100 x 0  Entered and Authorized by:   Roderick Pee MD   Signed by:   Roderick Pee MD on 09/24/2010   Method used:   Print then Give to Patient   RxID:   [id number] METHYLPHENIDATE HCL 10 MG TABS (METHYLPHENIDATE HCL) Take 1 tablet by mouth 3  times a day  #100 x 0   Entered and Authorized by:   Roderick Pee MD   Signed by:   Roderick Pee MD on 09/24/2010   Method used:   Print then Give to Patient   RxID:   [id number] METHYLPHENIDATE HCL 10 MG TABS (METHYLPHENIDATE HCL)  Take 1 tablet by mouth 3  times a day  #100 x 0   Entered and Authorized by:   Roderick Pee MD   Signed by:   Roderick Pee MD on 09/24/2010   Method used:   Print then Give to Patient   RxID:   [id number]    Orders Added: 1)  New Patient Level II [30160]

## 2010-11-22 ENCOUNTER — Telehealth: Payer: Self-pay | Admitting: Family Medicine

## 2010-11-22 DIAGNOSIS — F988 Other specified behavioral and emotional disorders with onset usually occurring in childhood and adolescence: Secondary | ICD-10-CM

## 2010-11-22 MED ORDER — METHYLPHENIDATE HCL ER (LA) 10 MG PO CP24: 10.0000 mg | ORAL_CAPSULE | Freq: Three times a day (TID) | ORAL | Status: DC

## 2010-11-22 NOTE — Telephone Encounter (Signed)
Refill  Ritalin

## 2010-11-22 NOTE — Telephone Encounter (Signed)
rx ready for pick up. Left message on machine for patient. 

## 2010-11-24 ENCOUNTER — Telehealth: Payer: Self-pay | Admitting: *Deleted

## 2010-11-24 NOTE — Telephone Encounter (Signed)
[facility] Triage Call Report Triage Record Num: [id number] Operator: Tomasita Crumble Patient Name: Brenda Lyons Call Date & Time: 11/23/2010 5:41:08PM Patient Phone: [phone] PCP: Patient Gender: Female PCP Fax : Patient DOB: September 09, 1971 [facility] Name: [facility] Reason for Call: Hermenia Bers, called from [facility] [phone]. Pharmacy states Dr. Tawanna Cooler wrote Ritalin LA tid; normally daily. Her normal Rx. is Ritalin (not LA). Pharmacy states they will require new Rx. anyway; patient to follow up with office on 2/22. Protocol(s) Used: Medication Question Calls, No Triage (Adults) Recommended Outcome per Protocol: Call Provider within 24 Hours Reason for Outcome: Pharmacy calling with prescription question and triager unable to answer question Care Advice: ~ 11/23/2010 5:53:13PM Page 1 of 1 [email or web address]

## 2010-11-24 NOTE — Telephone Encounter (Signed)
Left message on machine for patient  To return our call 

## 2010-11-24 NOTE — Telephone Encounter (Signed)
Pt had picked up a rx for Ritalin. She took rx to the pharmacy and was told that the rx wasn't what she normally takes. She would like to know which strength she is supposed to be on, the short acting or long acting? Please return her call. Thanks.

## 2010-11-25 ENCOUNTER — Other Ambulatory Visit: Payer: Self-pay | Admitting: *Deleted

## 2010-11-25 DIAGNOSIS — F988 Other specified behavioral and emotional disorders with onset usually occurring in childhood and adolescence: Secondary | ICD-10-CM

## 2010-11-25 MED ORDER — METHYLPHENIDATE HCL 10 MG PO TABS: 10.0000 mg | ORAL_TABLET | Freq: Three times a day (TID) | ORAL | Status: DC

## 2010-11-30 ENCOUNTER — Ambulatory Visit (INDEPENDENT_AMBULATORY_CARE_PROVIDER_SITE_OTHER): Payer: Self-pay | Admitting: Family Medicine

## 2010-11-30 ENCOUNTER — Encounter: Payer: Self-pay | Admitting: Family Medicine

## 2010-11-30 VITALS — BP 98/64 | Temp 98.2°F | Ht 64.0 in | Wt 117.0 lb

## 2010-11-30 DIAGNOSIS — F988 Other specified behavioral and emotional disorders with onset usually occurring in childhood and adolescence: Secondary | ICD-10-CM

## 2010-11-30 DIAGNOSIS — L281 Prurigo nodularis: Secondary | ICD-10-CM

## 2010-11-30 DIAGNOSIS — L280 Lichen simplex chronicus: Secondary | ICD-10-CM

## 2010-11-30 MED ORDER — METHYLPHENIDATE HCL 10 MG PO TABS: ORAL_TABLET | ORAL | Status: DC

## 2010-11-30 MED ORDER — METHYLPHENIDATE HCL 10 MG PO TABS: 10.0000 mg | ORAL_TABLET | Freq: Every day | ORAL | Status: DC

## 2010-11-30 MED ORDER — METHYLPHENIDATE HCL 10 MG PO TABS: 10.0000 mg | ORAL_TABLET | Freq: Three times a day (TID) | ORAL | Status: DC

## 2010-11-30 MED ORDER — CEPHALEXIN 500 MG PO CAPS: ORAL_CAPSULE | ORAL | Status: AC

## 2010-11-30 NOTE — Progress Notes (Signed)
  Subjective:    Patient ID: Brenda Lyons, female    DOB: 05-May-1971, 40 y.o.   MRN: [id number]  HPI Brenda Lyons is a 40 year old, married female, smoker,,,,,,,,,, but she's decreased her consumption from a pack a day to one a day without the chantix.  She is determined to stop on her own,,,,,,,,, comes in today for evaluation of swelling and redness in her right ear.  She noticed this on Saturday.  It is red, hot, swollen, and painful.  No history of trauma.  We did review the biopsy.  She had a couple years ago by Dr. Terri Piedra, it's a form of nodularis, etiology unknown   Review of Systems    Negative Objective:   Physical Exam    Well-developed well-nourished, female, in no acute distress.  Examination of the right ear shows low to be somewhat red and swollen.    Assessment & Plan:  Cellulitis right ear,,,,,,,,, Keflex two tabs b.i.d. Warm soaks return p.r.n.

## 2010-11-30 NOTE — Patient Instructions (Signed)
Take Keflex, two tabs b.i.d. For 10 days for the cellulitis and near right ear.  For the nodularis I would take a plane 10-mg Claritin or Zyrtec every night at bedtime.  Once the cellulitis clears we might want to consider cryotherapy

## 2010-12-22 ENCOUNTER — Ambulatory Visit: Payer: Self-pay | Admitting: Family Medicine

## 2011-01-05 ENCOUNTER — Ambulatory Visit (INDEPENDENT_AMBULATORY_CARE_PROVIDER_SITE_OTHER): Payer: Self-pay | Admitting: Family Medicine

## 2011-01-05 ENCOUNTER — Encounter: Payer: Self-pay | Admitting: Family Medicine

## 2011-01-05 DIAGNOSIS — L281 Prurigo nodularis: Secondary | ICD-10-CM

## 2011-01-05 DIAGNOSIS — L280 Lichen simplex chronicus: Secondary | ICD-10-CM

## 2011-01-05 NOTE — Progress Notes (Signed)
  Subjective:    Patient ID: Brenda Lyons, female    DOB: December 30, 1970, 40 y.o.   MRN: [id number]  HPI Brenda Lyons is a 40 year old female, who comes in today for cryotherapy of her dermatologic problem.  She had one lesion treated on her right cheek however, the greater blister and a scar.  We talked about treating her with small amounts frequently, every two weeks to prevent the scarring   Review of Systems    Generally dermatologic review of systems otherwise negative Objective:   Physical Exam    Well-developed and nourished, female in no acute distress.  She had 7 nodules on her face, one on her left leg, which are treated with cryo-    Assessment & Plan:  Nodularis,,,,,,, prior time 7 return two weeks for follow-up

## 2011-01-19 ENCOUNTER — Ambulatory Visit (INDEPENDENT_AMBULATORY_CARE_PROVIDER_SITE_OTHER): Payer: Self-pay | Admitting: Family Medicine

## 2011-01-19 ENCOUNTER — Encounter: Payer: Self-pay | Admitting: Family Medicine

## 2011-01-19 DIAGNOSIS — L280 Lichen simplex chronicus: Secondary | ICD-10-CM

## 2011-01-19 DIAGNOSIS — L281 Prurigo nodularis: Secondary | ICD-10-CM

## 2011-01-19 NOTE — Progress Notes (Signed)
  Subjective:    Patient ID: Brenda Lyons, female    DOB: January 25, 1971, 40 y.o.   MRN: [id number]  HPI Lakecia is a delightful 40 year old, married female, smoker,,,,,,,,,,, but down to two cigarettes a day,,,,,, without the chantix,,,,,,,,, who comes in today for treatment of her nodularis with cryo-  We treated this two weeks ago, and one lesion on her cheek is almost completely healed.  The other lesions on her face will be treated today along with a lesion in her left lower leg   Review of Systems General and dermatologic review of systems otherwise negative    Objective:   Physical Exam    Well-developed well-nourished, female, in no acute distress    Assessment & Plan:  Nodularis,,,,,,,, treated with Liquid nitrogen,,,,,, return in two weeks

## 2011-01-26 ENCOUNTER — Encounter: Payer: Self-pay | Admitting: Family Medicine

## 2011-01-31 ENCOUNTER — Telehealth: Payer: Self-pay | Admitting: Family Medicine

## 2011-01-31 DIAGNOSIS — F988 Other specified behavioral and emotional disorders with onset usually occurring in childhood and adolescence: Secondary | ICD-10-CM

## 2011-01-31 NOTE — Telephone Encounter (Signed)
Pt called and is req script of Ritalin 10 mg tid. Pt cx ov for 02/02/11 and has rsc to 02/09/11. Pls call when ready for pick up.

## 2011-02-01 MED ORDER — METHYLPHENIDATE HCL 10 MG PO TABS: 10.0000 mg | ORAL_TABLET | Freq: Three times a day (TID) | ORAL | Status: DC

## 2011-02-01 NOTE — Telephone Encounter (Signed)
Left message on machine rx ready for pick up  

## 2011-02-02 ENCOUNTER — Ambulatory Visit: Payer: Self-pay | Admitting: Family Medicine

## 2011-02-09 ENCOUNTER — Ambulatory Visit (INDEPENDENT_AMBULATORY_CARE_PROVIDER_SITE_OTHER): Payer: Self-pay | Admitting: Family Medicine

## 2011-02-09 DIAGNOSIS — F988 Other specified behavioral and emotional disorders with onset usually occurring in childhood and adolescence: Secondary | ICD-10-CM

## 2011-02-10 NOTE — Progress Notes (Signed)
  Subjective:    Patient ID: Brenda Lyons, female    DOB: 09/01/1971, 40 y.o.   MRN: [id number]  HPI Brenda Lyons comes in today for treatment of 4 nodular lesions with cryotherapy.  Four lesions on her face was treated without complications.  Asked to return in two to 3 weeks for retreatment.  No chart   Review of Systems     Objective:   Physical Exam    Well-developed well-nourished, female, in no acute distress, not delirious, x 3 on her face    Assessment & Plan:  Nodularis,,,,,,,,,, treated with cryo- x 4.  Return in two weeks

## 2011-02-18 NOTE — Op Note (Signed)
[name], Brenda Lyons          ACCOUNT NO.:  [id number]   MEDICAL RECORD NO.:  [id number]          PATIENT TYPE:  INP   LOCATION:  9117                          FACILITY:  [facility]   PHYSICIAN:  Phil D. Okey Dupre, M.D.     DATE OF BIRTH:  Apr 05, 1971   DATE OF PROCEDURE:  09/07/2005  DATE OF DISCHARGE:                                 OPERATIVE REPORT   PREOPERATIVE DIAGNOSIS:  Patient fatigue.  Postdate pregnancy. VBAC.   POSTOPERATIVE DIAGNOSIS:  Patient fatigue.  Postdate pregnancy. VBAC.   PROCEDURE:  Attempted vacuum assisted delivery, low outlet forceps delivery.  A right mediolateral episiotomy.   DESCRIPTION OF PROCEDURE:  The patient underwent a satisfactory epidural  anesthesia, in the dorsal lithotomy position. Perineum having been prepped  and the perineum draped in usual sterile manner. The vacuum head was placed  on the occiput in an LOA presentation and after four failed tries at  delivery, the Luikhart-McLane forceps were applied and baby easily delivered  in a LOA presentation. The airway was with heavy meconium that had been  longstanding. The baby was suctioned with a DeLee suction. Cord doubly  clamped, divided, and baby handed to pediatrician and samples of cord taken  for analysis. There was a significant amount of thin meconium and the cord  was hyperspiral. The placenta spontaneously delivered. Prior to delivery  with the vacuum, a right medial episiotomy was cut staying outside of the  patient's hemorrhoid. The vagina was explored and the episiotomy repaired in  usual manner with 2-0 chromic catgut suture. Total blood loss during the  procedure 500 mL. The patient tolerated the procedure well and was in  satisfactory condition at the end of the procedure.           ______________________________  Javier Glazier. Okey Dupre, M.D.     PDR/MEDQ  D:  09/07/2005  T:  09/07/2005  Job:  [id number]

## 2011-02-23 ENCOUNTER — Ambulatory Visit: Payer: Self-pay | Admitting: Family Medicine

## 2011-03-01 ENCOUNTER — Other Ambulatory Visit: Payer: Self-pay | Admitting: Family Medicine

## 2011-03-01 DIAGNOSIS — F988 Other specified behavioral and emotional disorders with onset usually occurring in childhood and adolescence: Secondary | ICD-10-CM

## 2011-03-01 NOTE — Telephone Encounter (Signed)
Pt needs new rx ritalin 10 mg three times a day

## 2011-03-02 MED ORDER — METHYLPHENIDATE HCL 10 MG PO TABS: ORAL_TABLET | ORAL | Status: DC

## 2011-03-02 MED ORDER — METHYLPHENIDATE HCL 10 MG PO TABS: 10.0000 mg | ORAL_TABLET | Freq: Three times a day (TID) | ORAL | Status: DC

## 2011-03-02 NOTE — Telephone Encounter (Signed)
rx ready for pick up and patient is aware  

## 2011-03-21 ENCOUNTER — Ambulatory Visit: Payer: Self-pay | Admitting: Family Medicine

## 2011-05-12 ENCOUNTER — Telehealth: Payer: Self-pay | Admitting: Family Medicine

## 2011-05-12 NOTE — Telephone Encounter (Signed)
Spoke with patient and appointment made for Monday

## 2011-05-12 NOTE — Telephone Encounter (Signed)
Pt has been experiencing sinus pressure, ear pain, and drainage for about a week and is wanted to come in to day. Please contact.

## 2011-05-16 ENCOUNTER — Encounter: Payer: Self-pay | Admitting: Family Medicine

## 2011-05-16 ENCOUNTER — Ambulatory Visit (INDEPENDENT_AMBULATORY_CARE_PROVIDER_SITE_OTHER): Payer: Self-pay | Admitting: Family Medicine

## 2011-05-16 DIAGNOSIS — T7840XA Allergy, unspecified, initial encounter: Secondary | ICD-10-CM

## 2011-05-16 DIAGNOSIS — F17200 Nicotine dependence, unspecified, uncomplicated: Secondary | ICD-10-CM

## 2011-05-16 NOTE — Progress Notes (Signed)
  Subjective:    Patient ID: MAYSEN BONSIGNORE, female    DOB: 05-Mar-1971, 40 y.o.   MRN: [id number]  HPI Chaye is a 40 year old, married female, smoker,,,,,,,,, 3 cigarettes a day.  He was smoke free for one month,,,,,,,,, who comes in today for violation of allergic rhinitis.  The past, week she's had a congestion and pain and popping in both ears.  She is back to smoking, but only 3 cigarettes a day.  She has underlying ADD and takes Ritalin 10 mg t.i.d. Plain doing well able to focus and concentrate.   Review of Systems    Review of systems negative Objective:   Physical Exam Well-developed well-nourished, female, in no acute distress.  Examination HEENT were negative, except for bilateral nasal edema.  Septum was in the midline.  Neck was supple.  No adenopathy.  Lungs are clear.  No wheezing.  Skin exam normal      Assessment & Plan:  Healthy female.  Allergic rhinitis.  Continue the Zyrtec, 10 mg nightly along with the aspirin and steroid nasal spray.  DC Afrin.  After 5 nights.  Tobacco abuse.  Chantix one half tab daily.  Stop smoking completely

## 2011-05-20 ENCOUNTER — Other Ambulatory Visit: Payer: Self-pay | Admitting: *Deleted

## 2011-05-20 DIAGNOSIS — F988 Other specified behavioral and emotional disorders with onset usually occurring in childhood and adolescence: Secondary | ICD-10-CM

## 2011-05-20 NOTE — Telephone Encounter (Signed)
VM from pt requesting Ritalin refill.  Last filled 03-02-11, Three scripts #90

## 2011-05-23 MED ORDER — METHYLPHENIDATE HCL 10 MG PO TABS: ORAL_TABLET | ORAL | Status: DC

## 2011-05-23 MED ORDER — METHYLPHENIDATE HCL 10 MG PO TABS: 10.0000 mg | ORAL_TABLET | Freq: Three times a day (TID) | ORAL | Status: DC

## 2011-05-23 NOTE — Telephone Encounter (Signed)
rx ready for pick up and patient is aware  

## 2011-08-23 ENCOUNTER — Other Ambulatory Visit: Payer: Self-pay | Admitting: Family Medicine

## 2011-08-23 DIAGNOSIS — F988 Other specified behavioral and emotional disorders with onset usually occurring in childhood and adolescence: Secondary | ICD-10-CM

## 2011-08-23 MED ORDER — METHYLPHENIDATE HCL 10 MG PO TABS: ORAL_TABLET | ORAL | Status: DC

## 2011-08-23 MED ORDER — METHYLPHENIDATE HCL 10 MG PO TABS: 10.0000 mg | ORAL_TABLET | Freq: Three times a day (TID) | ORAL | Status: DC

## 2011-08-23 NOTE — Telephone Encounter (Signed)
rx ready for pick up and patient is aware  

## 2011-08-23 NOTE — Telephone Encounter (Signed)
Pt need new rx generic ritalin 10 mg

## 2011-11-08 ENCOUNTER — Ambulatory Visit (INDEPENDENT_AMBULATORY_CARE_PROVIDER_SITE_OTHER): Payer: Self-pay | Admitting: Family Medicine

## 2011-11-08 ENCOUNTER — Encounter: Payer: Self-pay | Admitting: Family Medicine

## 2011-11-08 VITALS — BP 110/74 | Temp 98.5°F | Wt 122.0 lb

## 2011-11-08 DIAGNOSIS — F17200 Nicotine dependence, unspecified, uncomplicated: Secondary | ICD-10-CM

## 2011-11-08 MED ORDER — VARENICLINE TARTRATE 1 MG PO TABS: ORAL_TABLET | ORAL | Status: DC

## 2011-11-08 MED ORDER — HYDROCODONE-HOMATROPINE 5-1.5 MG/5ML PO SYRP: ORAL_SOLUTION | ORAL | Status: DC

## 2011-11-08 MED ORDER — PREDNISONE 20 MG PO TABS: ORAL_TABLET | ORAL | Status: DC

## 2011-11-08 NOTE — Patient Instructions (Signed)
Take the prednisone as directed  No smoking Chantex,,,,,,,,, one half tab every morning  Hydromet 1/2 teaspoon each bedtime when necessary for cough

## 2011-11-08 NOTE — Progress Notes (Signed)
  Subjective:    Patient ID: Brenda Lyons, female    DOB: [birth year], 41 y.o.   MRN: [id number]  HPI Tristan is a 41 year old married female smoker who comes in today with a five-day history of her condition postnasal drip and cough  No fever no sputum production. She's decreased her smoking but not quite. We did give her a prescription for Chantix however she never got it filled. She's trying to taper    Review of Systems    general and pulmonary review of systems otherwise negative Objective:   Physical Exam Well-developed well-nourished female in no acute distress HEENT negative except for 4+ nasal edema neck was supple no adenopathy lungs are clear except for mild late expiratory wheezing on forced expiration       Assessment & Plan:  Viral syndrome with mild wheezing and tobacco abuse plan DC smoking completely, Carollee Herter takes one half tab daily, prednisone burst and taper, Hydromet when necessary for cough

## 2011-11-21 ENCOUNTER — Other Ambulatory Visit: Payer: Self-pay | Admitting: *Deleted

## 2011-11-21 DIAGNOSIS — F17200 Nicotine dependence, unspecified, uncomplicated: Secondary | ICD-10-CM

## 2011-11-21 MED ORDER — VARENICLINE TARTRATE 1 MG PO TABS: ORAL_TABLET | ORAL | Status: DC

## 2011-11-25 ENCOUNTER — Telehealth: Payer: Self-pay | Admitting: Family Medicine

## 2011-11-25 DIAGNOSIS — F988 Other specified behavioral and emotional disorders with onset usually occurring in childhood and adolescence: Secondary | ICD-10-CM

## 2011-11-25 NOTE — Telephone Encounter (Signed)
Pt called req 3 month script for methylphenidate (RITALIN) 10 MG tablet

## 2011-11-28 MED ORDER — METHYLPHENIDATE HCL 10 MG PO TABS: 10.0000 mg | ORAL_TABLET | Freq: Three times a day (TID) | ORAL | Status: DC

## 2011-11-28 MED ORDER — METHYLPHENIDATE HCL 10 MG PO TABS: ORAL_TABLET | ORAL | Status: DC

## 2011-11-28 NOTE — Telephone Encounter (Signed)
Rx ready for pick up Left message on machine  

## 2011-12-07 ENCOUNTER — Encounter: Payer: Self-pay | Admitting: *Deleted

## 2012-02-28 ENCOUNTER — Other Ambulatory Visit: Payer: Self-pay | Admitting: Family Medicine

## 2012-02-28 DIAGNOSIS — F988 Other specified behavioral and emotional disorders with onset usually occurring in childhood and adolescence: Secondary | ICD-10-CM

## 2012-02-28 MED ORDER — METHYLPHENIDATE HCL 10 MG PO TABS: ORAL_TABLET | ORAL | Status: DC

## 2012-02-28 MED ORDER — METHYLPHENIDATE HCL 10 MG PO TABS: 10.0000 mg | ORAL_TABLET | Freq: Three times a day (TID) | ORAL | Status: DC

## 2012-02-28 NOTE — Telephone Encounter (Signed)
Pt needs new rx ritalin 10 mg °

## 2012-02-28 NOTE — Telephone Encounter (Signed)
rx ready for pick up. Left message on machine for patient. 

## 2012-06-06 ENCOUNTER — Other Ambulatory Visit: Payer: Self-pay | Admitting: Family Medicine

## 2012-06-06 DIAGNOSIS — F988 Other specified behavioral and emotional disorders with onset usually occurring in childhood and adolescence: Secondary | ICD-10-CM

## 2012-06-06 NOTE — Telephone Encounter (Signed)
Pt needs new rxs for  Ritalin 10mg  for next 3 months

## 2012-06-07 MED ORDER — METHYLPHENIDATE HCL 10 MG PO TABS: ORAL_TABLET | ORAL | Status: DC

## 2012-06-07 MED ORDER — METHYLPHENIDATE HCL 10 MG PO TABS: 10.0000 mg | ORAL_TABLET | Freq: Three times a day (TID) | ORAL | Status: DC

## 2012-06-07 NOTE — Telephone Encounter (Signed)
Rx ready for pick up.  Left message on machine for patient. 

## 2012-09-24 ENCOUNTER — Other Ambulatory Visit: Payer: Self-pay | Admitting: Family Medicine

## 2012-09-24 DIAGNOSIS — F988 Other specified behavioral and emotional disorders with onset usually occurring in childhood and adolescence: Secondary | ICD-10-CM

## 2012-09-24 NOTE — Telephone Encounter (Signed)
Pt would like refill of ridalin 10mg  3 X day.

## 2012-09-27 ENCOUNTER — Telehealth: Payer: Self-pay | Admitting: *Deleted

## 2012-09-27 MED ORDER — METHYLPHENIDATE HCL 10 MG PO TABS: ORAL_TABLET | ORAL | Status: DC

## 2012-09-27 MED ORDER — METHYLPHENIDATE HCL 10 MG PO TABS: 10.0000 mg | ORAL_TABLET | Freq: Three times a day (TID) | ORAL | Status: DC

## 2012-09-27 NOTE — Telephone Encounter (Signed)
Rx ready for pick up. 

## 2012-09-27 NOTE — Telephone Encounter (Signed)
Left message on voicemail. Rx is ready for pick up.

## 2012-09-28 NOTE — Telephone Encounter (Signed)
Left detailed message on voicemail that Rx is ready for pick up.

## 2012-12-31 ENCOUNTER — Telehealth: Payer: Self-pay | Admitting: Family Medicine

## 2012-12-31 DIAGNOSIS — F988 Other specified behavioral and emotional disorders with onset usually occurring in childhood and adolescence: Secondary | ICD-10-CM

## 2012-12-31 MED ORDER — METHYLPHENIDATE HCL 10 MG PO TABS: ORAL_TABLET | ORAL | Status: DC

## 2012-12-31 MED ORDER — METHYLPHENIDATE HCL 10 MG PO TABS: 10.0000 mg | ORAL_TABLET | Freq: Three times a day (TID) | ORAL | Status: DC

## 2012-12-31 NOTE — Telephone Encounter (Signed)
Pt needs refill of methylphenidate (RITALIN) 10 MG tablet

## 2012-12-31 NOTE — Telephone Encounter (Signed)
Left message on machine for patient Rx ready for pick up.  Appointment needed for more refills

## 2013-01-29 ENCOUNTER — Encounter: Payer: Self-pay | Admitting: Family Medicine

## 2013-01-29 ENCOUNTER — Ambulatory Visit (INDEPENDENT_AMBULATORY_CARE_PROVIDER_SITE_OTHER): Payer: Self-pay | Admitting: Family Medicine

## 2013-01-29 VITALS — BP 110/78 | Temp 98.4°F | Wt 125.0 lb

## 2013-01-29 DIAGNOSIS — F17200 Nicotine dependence, unspecified, uncomplicated: Secondary | ICD-10-CM

## 2013-01-29 DIAGNOSIS — F988 Other specified behavioral and emotional disorders with onset usually occurring in childhood and adolescence: Secondary | ICD-10-CM

## 2013-01-29 MED ORDER — METHYLPHENIDATE HCL ER (LA) 10 MG PO CP24
10.0000 mg | ORAL_CAPSULE | Freq: Three times a day (TID) | ORAL | Status: DC
Start: 2013-01-29 — End: 2013-01-29

## 2013-01-29 MED ORDER — METHYLPHENIDATE HCL 10 MG PO TABS: ORAL_TABLET | ORAL | Status: DC

## 2013-01-29 MED ORDER — VARENICLINE TARTRATE 1 MG PO TABS: ORAL_TABLET | ORAL | Status: DC

## 2013-01-29 MED ORDER — METHYLPHENIDATE HCL 10 MG PO TABS: 10.0000 mg | ORAL_TABLET | Freq: Three times a day (TID) | ORAL | Status: DC

## 2013-01-29 NOTE — Patient Instructions (Signed)
Continue the Ritalin 10 mg 3 times daily  Restart the Chantix........... One half tab daily and taper by one per week  Set up a time in the next 60 days,,,,,,,,,,,,,, 30 minute appointment,,,,,,,, for general physical examination,,,,,,,,,,, no labs ahead of time

## 2013-01-29 NOTE — Progress Notes (Signed)
  Subjective:    Patient ID: Brenda Lyons, female    DOB: 1971-07-10, 42 y.o.   MRN: [id number]  HPI Brenda Lyons is a 42 year old single female G3 P3........ Children are 17 7 and 6........ Who comes in today for refill of her Ritalin and to discuss smoking cessation  She takes within 10 mg 3 times daily for adult ADD. We also gave her the Chantix program one half tab daily when she quit smoking for 2 months. She's restarted but only 6 per day. She would like to restart the Chantix   Review of Systems    review of systems negative except for medical exam is due this summer she has a Mirena IUD that 42 years old Objective:   Physical Exam Well-developed well nourished female no acute distress       Assessment & Plan:  Adult ADD refill Ritalin 10 mg 3 times a day  Tobacco abuse restart Chantix program one half tab daily

## 2013-04-08 ENCOUNTER — Encounter: Payer: Self-pay | Admitting: Family Medicine

## 2013-04-08 ENCOUNTER — Other Ambulatory Visit (HOSPITAL_COMMUNITY)
Admission: RE | Admit: 2013-04-08 | Discharge: 2013-04-08 | Disposition: A | Discharge status: Home / Self Care | Payer: BC Managed Care – PPO | Source: Ambulatory Visit | Attending: Family Medicine | Admitting: Family Medicine

## 2013-04-08 ENCOUNTER — Ambulatory Visit (INDEPENDENT_AMBULATORY_CARE_PROVIDER_SITE_OTHER): Payer: BC Managed Care – PPO | Admitting: Family Medicine

## 2013-04-08 VITALS — BP 120/80 | Temp 98.6°F | Ht 65.75 in | Wt 124.0 lb

## 2013-04-08 DIAGNOSIS — F17200 Nicotine dependence, unspecified, uncomplicated: Secondary | ICD-10-CM

## 2013-04-08 DIAGNOSIS — F988 Other specified behavioral and emotional disorders with onset usually occurring in childhood and adolescence: Secondary | ICD-10-CM

## 2013-04-08 DIAGNOSIS — Z30432 Encounter for removal of intrauterine contraceptive device: Secondary | ICD-10-CM

## 2013-04-08 DIAGNOSIS — Z01419 Encounter for gynecological examination (general) (routine) without abnormal findings: Secondary | ICD-10-CM

## 2013-04-08 DIAGNOSIS — Z0000 Encounter for general adult medical examination without abnormal findings: Secondary | ICD-10-CM

## 2013-04-08 LAB — CBC WITH DIFFERENTIAL/PLATELET
Eosinophils Relative: 2.8 % (ref 0.0–5.0)
HCT: 35.7 % — ABNORMAL LOW (ref 36.0–46.0)
Lymphocytes Relative: 21.8 % (ref 12.0–46.0)
Monocytes Relative: 6.3 % (ref 3.0–12.0)
Neutrophils Relative %: 68.7 % (ref 43.0–77.0)
Platelets: 233 10*3/uL (ref 150.0–400.0)
WBC: 10.2 10*3/uL (ref 4.5–10.5)

## 2013-04-08 LAB — POCT URINALYSIS DIPSTICK
Bilirubin, UA: NEGATIVE
Ketones, UA: NEGATIVE
Protein, UA: NEGATIVE
Spec Grav, UA: >=1.03
pH, UA: 5.5

## 2013-04-08 MED ORDER — METHYLPHENIDATE HCL 10 MG PO TABS: 10.0000 mg | ORAL_TABLET | Freq: Three times a day (TID) | ORAL | Status: DC

## 2013-04-08 MED ORDER — METHYLPHENIDATE HCL 10 MG PO TABS: ORAL_TABLET | ORAL | Status: DC

## 2013-04-08 NOTE — Patient Instructions (Addendum)
Call the breast Center and get set up for your mammogram  Do a thorough breast exam as I showed you once monthly after each.  Condoms for birth control  Return in one year sooner if any problems  Remember to call 2 weeks from being out of your Ritalin for refills and leave a message only with Fleet Contras

## 2013-04-08 NOTE — Progress Notes (Signed)
  Subjective:    Patient ID: Brenda Lyons, female    DOB: 09-26-1971, 42 y.o.   MRN: [id number]  HPI  Jody is a 42 year old married female smoker...Marland KitchenMarland KitchenMarland Kitchen Down to 3 cigarettes a day........ Who comes in today for general physical examination  She had a Mirena IUD put in 6 years ago. It's time to remove it. We discussed his options. She would like just to use condoms  She takes Ritalin 10 mg 3 times daily for adult ADD  We have given her a prescription for Chantix which she didn't get filled however she's decreased her smoking down to 3 cigarettes a day.  She gets routine eye care patient has contact lenses, regular dental care, BSE monthly and has never had a mammogram.  Family history pertinent her mother had breast cancer when she was in her early 50s  Review of Systems  Constitutional: Negative.   HENT: Negative.   Eyes: Negative.   Respiratory: Negative.   Cardiovascular: Negative.   Gastrointestinal: Negative.   Genitourinary: Negative.   Musculoskeletal: Negative.   Neurological: Negative.   Psychiatric/Behavioral: Negative.        Objective:   Physical Exam  Constitutional: She appears well-developed and well-nourished.  HENT:  Head: Normocephalic and atraumatic.  Right Ear: External ear normal.  Left Ear: External ear normal.  Nose: Nose normal.  Mouth/Throat: Oropharynx is clear and moist.  Eyes: EOM are normal. Pupils are equal, round, and reactive to light.  Neck: Normal range of motion. Neck supple. No thyromegaly present.  Cardiovascular: Normal rate, regular rhythm, normal heart sounds and intact distal pulses.  Exam reveals no gallop and no friction rub.   No murmur heard. Pulmonary/Chest: Effort normal and breath sounds normal.  Abdominal: Soft. Bowel sounds are normal. She exhibits no distension and no mass. There is no tenderness. There is no rebound.  Genitourinary: Vagina normal and uterus normal. Guaiac negative stool. No vaginal discharge  found.   IUD removedIUD removed  Bilateral breast exam normal BSE was taught and she was advised to be and annual mammography now  Musculoskeletal: Normal range of motion.  Lymphadenopathy:    She has no cervical adenopathy.  Neurological: She is alert. She has normal reflexes. No cranial nerve deficit. She exhibits normal muscle tone. Coordination normal.  Skin: Skin is warm and dry.  Psychiatric: She has a normal mood and affect. Her behavior is normal. Judgment and thought content normal.          Assessment & Plan:  Healthy female  Tobacco abuse down to 3 cigarettes a day begin Chantix program to wean off completely  Adult ADD continue Ritalin 10 mg 3 times daily  Family history of breast cancer,,,, mom,,,,,,,, BSE monthly begin mammography now

## 2013-04-09 LAB — BASIC METABOLIC PANEL
BUN: 19 mg/dL (ref 6–23)
CO2: 30 mEq/L (ref 19–32)
Chloride: 105 mEq/L (ref 96–112)
Creatinine, Ser: 0.8 mg/dL (ref 0.4–1.2)
Potassium: 4.6 mEq/L (ref 3.5–5.1)

## 2013-04-09 LAB — HEPATIC FUNCTION PANEL
ALT: 22 U/L (ref 0–35)
AST: 19 U/L (ref 0–37)
Alkaline Phosphatase: 64 U/L (ref 39–117)
Bilirubin, Direct: 0 mg/dL (ref 0.0–0.3)
Total Bilirubin: 0.4 mg/dL (ref 0.3–1.2)

## 2013-06-24 ENCOUNTER — Ambulatory Visit: Payer: BC Managed Care – PPO | Admitting: Family Medicine

## 2013-07-15 ENCOUNTER — Encounter: Payer: Self-pay | Admitting: Family Medicine

## 2013-07-15 ENCOUNTER — Ambulatory Visit (INDEPENDENT_AMBULATORY_CARE_PROVIDER_SITE_OTHER): Payer: BC Managed Care – PPO | Admitting: Family Medicine

## 2013-07-15 VITALS — BP 104/78 | Temp 98.7°F | Wt 126.0 lb

## 2013-07-15 DIAGNOSIS — J069 Acute upper respiratory infection, unspecified: Secondary | ICD-10-CM | POA: Insufficient documentation

## 2013-07-15 DIAGNOSIS — Z23 Encounter for immunization: Secondary | ICD-10-CM

## 2013-07-15 DIAGNOSIS — F17200 Nicotine dependence, unspecified, uncomplicated: Secondary | ICD-10-CM

## 2013-07-15 DIAGNOSIS — D509 Iron deficiency anemia, unspecified: Secondary | ICD-10-CM | POA: Insufficient documentation

## 2013-07-15 DIAGNOSIS — E611 Iron deficiency: Secondary | ICD-10-CM

## 2013-07-15 MED ORDER — HYDROCODONE-HOMATROPINE 5-1.5 MG/5ML PO SYRP: ORAL_SOLUTION | ORAL | Status: DC

## 2013-07-15 NOTE — Patient Instructions (Addendum)
Continue to not smoke!!!!!!!!!!!!!!!  Drink lots of water  Hydromet 1/2-1 teaspoon each bedtime when necessary for cough and cold  Iron one tablet nightly at bedtime for 2 months then stop the iron  Return when you're due for your annual exam,,,,,,,,,,,, July

## 2013-07-15 NOTE — Progress Notes (Signed)
  Subjective:    Patient ID: LAQUIDA COTRELL, female    DOB: 06-11-1971, 42 y.o.   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 We removed her IUD since that time she's had 2 periods the first one lasted 10 days the recent one lasted 5 days. She's also having some hot flushes which is not inconsistent with removing the IUD  She does not recall when her mother went through menopause.  Birth control she can use condoms  She's not smoke for a week because she's had a bad cold she's had a condition popping ears hearing loss and some yellow sputum no fever.   Review of Systems    review of systems negative except for kids have had a cold since he went back to school Objective:   Physical Exam  Well-developed well-nourished female no acute distress examination HEENT negative neck was supple no adenopathy lungs are clear      Assessment & Plan:  Viral syndrome plan treat symptomatically #2 tobacco abuse again encouraged stop smoking  Iron deficiency anemia hemoglobin up to 12.8 take iron for another 6 weeks then discontinue

## 2013-08-21 ENCOUNTER — Telehealth: Payer: Self-pay | Admitting: Family Medicine

## 2013-08-21 DIAGNOSIS — F988 Other specified behavioral and emotional disorders with onset usually occurring in childhood and adolescence: Secondary | ICD-10-CM

## 2013-08-21 MED ORDER — METHYLPHENIDATE HCL 10 MG PO TABS: ORAL_TABLET | ORAL | Status: DC

## 2013-08-21 MED ORDER — METHYLPHENIDATE HCL 10 MG PO TABS: 10.0000 mg | ORAL_TABLET | Freq: Three times a day (TID) | ORAL | Status: DC

## 2013-08-21 NOTE — Telephone Encounter (Signed)
Rx ready for pick up.  Left message on machine for pick up.

## 2013-08-21 NOTE — Telephone Encounter (Signed)
Pt needs new rx ritalin 10 mg #90. Pt would like rxs for next 3 months

## 2013-11-29 ENCOUNTER — Telehealth: Payer: Self-pay | Admitting: Family Medicine

## 2013-11-29 DIAGNOSIS — F988 Other specified behavioral and emotional disorders with onset usually occurring in childhood and adolescence: Secondary | ICD-10-CM

## 2013-11-29 NOTE — Telephone Encounter (Signed)
Pt needs new rx ritalin °

## 2013-12-02 MED ORDER — METHYLPHENIDATE HCL 10 MG PO TABS: ORAL_TABLET | ORAL | Status: DC

## 2013-12-02 MED ORDER — METHYLPHENIDATE HCL 10 MG PO TABS: 10.0000 mg | ORAL_TABLET | Freq: Three times a day (TID) | ORAL | Status: DC

## 2013-12-02 NOTE — Telephone Encounter (Signed)
Rx ready for pick up. 

## 2014-02-20 ENCOUNTER — Telehealth: Payer: Self-pay | Admitting: Family Medicine

## 2014-02-20 DIAGNOSIS — F988 Other specified behavioral and emotional disorders with onset usually occurring in childhood and adolescence: Secondary | ICD-10-CM

## 2014-02-20 NOTE — Telephone Encounter (Signed)
Pt request methylphenidate (RITALIN) 10 MG tablet 3 mo supply

## 2014-02-25 ENCOUNTER — Telehealth: Payer: Self-pay | Admitting: *Deleted

## 2014-02-25 NOTE — Telephone Encounter (Signed)
Error

## 2014-02-27 NOTE — Telephone Encounter (Signed)
Okay to pick up next week  

## 2014-02-28 NOTE — Telephone Encounter (Signed)
Pt called back need to pick up rx on monday

## 2014-03-03 MED ORDER — METHYLPHENIDATE HCL 10 MG PO TABS: 10.0000 mg | ORAL_TABLET | Freq: Three times a day (TID) | ORAL | Status: DC

## 2014-03-03 MED ORDER — METHYLPHENIDATE HCL 10 MG PO TABS: ORAL_TABLET | ORAL | Status: DC

## 2014-03-03 NOTE — Telephone Encounter (Signed)
rx ready for pick up and Left message on machine for patient 

## 2014-03-03 NOTE — Telephone Encounter (Signed)
Pt called to see if RX methylphenidate (RITALIN) 10 MG tablet is ready for pick-up?? She goes to work at 4:30 pm and would like to pick it up in route to work if possible.

## 2014-06-27 ENCOUNTER — Telehealth: Payer: Self-pay | Admitting: Family Medicine

## 2014-06-27 DIAGNOSIS — F988 Other specified behavioral and emotional disorders with onset usually occurring in childhood and adolescence: Secondary | ICD-10-CM

## 2014-06-27 NOTE — Telephone Encounter (Signed)
Pt needs new rx generic  ritalin 10 mg

## 2014-06-30 MED ORDER — METHYLPHENIDATE HCL 10 MG PO TABS: 10.0000 mg | ORAL_TABLET | Freq: Three times a day (TID) | ORAL | Status: DC

## 2014-06-30 MED ORDER — METHYLPHENIDATE HCL 10 MG PO TABS: ORAL_TABLET | ORAL | Status: DC

## 2014-06-30 NOTE — Telephone Encounter (Signed)
Rx ready for pick up and Left message on machine for patient   

## 2014-09-22 ENCOUNTER — Telehealth: Payer: Self-pay | Admitting: Family Medicine

## 2014-09-22 NOTE — Telephone Encounter (Signed)
Pt needs new rx generic ritalin

## 2014-09-23 NOTE — Telephone Encounter (Signed)
Left detailed message on machine patient will need an office visit for Ritalin refill.

## 2014-10-14 ENCOUNTER — Ambulatory Visit: Payer: Self-pay | Admitting: Family Medicine

## 2014-10-23 ENCOUNTER — Ambulatory Visit (INDEPENDENT_AMBULATORY_CARE_PROVIDER_SITE_OTHER): Payer: 59 | Admitting: Family Medicine

## 2014-10-23 ENCOUNTER — Encounter: Payer: Self-pay | Admitting: Family Medicine

## 2014-10-23 VITALS — BP 120/80 | Temp 98.9°F | Wt 118.0 lb

## 2014-10-23 DIAGNOSIS — F909 Attention-deficit hyperactivity disorder, unspecified type: Secondary | ICD-10-CM

## 2014-10-23 DIAGNOSIS — F988 Other specified behavioral and emotional disorders with onset usually occurring in childhood and adolescence: Secondary | ICD-10-CM

## 2014-10-23 MED ORDER — METHYLPHENIDATE HCL 10 MG PO TABS: ORAL_TABLET | ORAL | Status: DC

## 2014-10-23 MED ORDER — METHYLPHENIDATE HCL 10 MG PO TABS: 10.0000 mg | ORAL_TABLET | Freq: Three times a day (TID) | ORAL | Status: DC

## 2014-10-23 NOTE — Progress Notes (Signed)
   Subjective:    Patient ID: Brenda Lyons, female    DOB: 12-03-1970, 44 y.o.   MRN: [id number]  HPI Brenda Lyons is a 44 year old married female smoker...Marland Kitchen.Marland Kitchen.Marland Kitchen. 5 cigarettes a day..... She tried the Chantix program but even on 0.5 mg she developed CNS side effects of moodiness  She's here today for follow-up and renew her medication. We did a physical less than July 2014. She takes Ritalin 10 mg 3 times daily because of adult ADD.  She's doing well she works 2 jobs now at a [employer], [employer] and also at [employer] nights   Review of Systems    review of systems otherwise negative Objective:   Physical Exam  Well-developed well-nourished female no acute distress vital signs stable she's afebrile      Assessment & Plan:  Adult ADD,,,,,,,,,, continue Ritalin  Follow-up CPX the spring

## 2014-10-23 NOTE — Patient Instructions (Signed)
Continue current medication  Follow-up the second week in April for complete physical exam and Pap  Uterus and did not need any labs at a time

## 2014-10-23 NOTE — Progress Notes (Signed)
Pre visit review using our clinic review tool, if applicable. No additional management support is needed unless otherwise documented below in the visit note. 

## 2015-01-15 ENCOUNTER — Other Ambulatory Visit (HOSPITAL_COMMUNITY)
Admission: RE | Admit: 2015-01-15 | Discharge: 2015-01-15 | Disposition: A | Discharge status: Home / Self Care | Payer: 59 | Source: Ambulatory Visit | Attending: Family Medicine | Admitting: Family Medicine

## 2015-01-15 ENCOUNTER — Ambulatory Visit (INDEPENDENT_AMBULATORY_CARE_PROVIDER_SITE_OTHER): Payer: 59 | Admitting: Family Medicine

## 2015-01-15 ENCOUNTER — Encounter: Payer: Self-pay | Admitting: Family Medicine

## 2015-01-15 VITALS — BP 130/82 | Temp 98.9°F | Ht 64.0 in | Wt 117.9 lb

## 2015-01-15 DIAGNOSIS — L281 Prurigo nodularis: Secondary | ICD-10-CM

## 2015-01-15 DIAGNOSIS — Z01419 Encounter for gynecological examination (general) (routine) without abnormal findings: Secondary | ICD-10-CM | POA: Insufficient documentation

## 2015-01-15 DIAGNOSIS — Z720 Tobacco use: Secondary | ICD-10-CM

## 2015-01-15 DIAGNOSIS — F988 Other specified behavioral and emotional disorders with onset usually occurring in childhood and adolescence: Secondary | ICD-10-CM

## 2015-01-15 DIAGNOSIS — D509 Iron deficiency anemia, unspecified: Secondary | ICD-10-CM | POA: Diagnosis not present

## 2015-01-15 DIAGNOSIS — Z1151 Encounter for screening for human papillomavirus (HPV): Secondary | ICD-10-CM | POA: Insufficient documentation

## 2015-01-15 DIAGNOSIS — F909 Attention-deficit hyperactivity disorder, unspecified type: Secondary | ICD-10-CM | POA: Diagnosis not present

## 2015-01-15 DIAGNOSIS — Z0000 Encounter for general adult medical examination without abnormal findings: Secondary | ICD-10-CM

## 2015-01-15 DIAGNOSIS — F17200 Nicotine dependence, unspecified, uncomplicated: Secondary | ICD-10-CM

## 2015-01-15 MED ORDER — METHYLPHENIDATE HCL 10 MG PO TABS: 10.0000 mg | ORAL_TABLET | Freq: Three times a day (TID) | ORAL | Status: DC

## 2015-01-15 MED ORDER — METHYLPHENIDATE HCL 10 MG PO TABS: ORAL_TABLET | ORAL | Status: DC

## 2015-01-15 NOTE — Patient Instructions (Addendum)
Continue current medications  Follow-up in one year sooner if any problems  Call 2 weeks from being out of your third prescription for refills  Rachel's extension is [phone]  Call and make an appointment for your first screening mammogram at the breast center  Call Dr. Alvester MorinBell for dental checkup  Taper off the cigarettes as we discussed

## 2015-01-15 NOTE — Progress Notes (Signed)
Pre visit review using our clinic review tool, if applicable. No additional management support is needed unless otherwise documented below in the visit note. 

## 2015-01-15 NOTE — Progress Notes (Signed)
   Subjective:    Patient ID: Brenda Lyons, female    DOB: 26-Jul-1971, 44 y.o.   MRN: [id number]  HPI  Marcelino DusterMichelle is a 44 year old married female smoker who comes in today for general physical examination  Her LMP was 2 weeks ago normal. She's married but is not sexually active. She and her husband have been to counseling it didn't seem to help her not interested in pursuing anything else at this juncture. She does get routine eye care K she wears contacts. No regular dental care. Referred to Dr. Alvester MorinBell. She does BSE monthly. Never had a mammogram. Colonoscopy at age 44 no family history of colon cancer polyps  Tetanus booster 2011  Her medications Ritalin 10 mg 3 times a day for adult ADD. She has history of anemia from dysfunctional uterine bleeding.  Review of Systems  Constitutional: Negative.   HENT: Negative.   Eyes: Negative.   Respiratory: Negative.   Cardiovascular: Negative.   Gastrointestinal: Negative.   Endocrine: Negative.   Genitourinary: Negative.   Musculoskeletal: Negative.   Skin: Negative.   Allergic/Immunologic: Negative.   Neurological: Negative.   Hematological: Negative.   Psychiatric/Behavioral: Negative.        Objective:   Physical Exam  Constitutional: She appears well-developed and well-nourished.  HENT:  Head: Normocephalic and atraumatic.  Right Ear: External ear normal.  Left Ear: External ear normal.  Nose: Nose normal.  Mouth/Throat: Oropharynx is clear and moist.  Eyes: EOM are normal. Pupils are equal, round, and reactive to light.  Neck: Normal range of motion. Neck supple. No JVD present. No tracheal deviation present. No thyromegaly present.  Cardiovascular: Normal rate, regular rhythm, normal heart sounds and intact distal pulses.  Exam reveals no gallop and no friction rub.   No murmur heard. Pulmonary/Chest: Effort normal and breath sounds normal. No stridor. No respiratory distress. She has no wheezes. She has no rales. She  exhibits no tenderness.  Abdominal: Soft. Bowel sounds are normal. She exhibits no distension and no mass. There is no tenderness. There is no rebound and no guarding.  Genitourinary: Vagina normal and uterus normal. Guaiac negative stool. No vaginal discharge found.  Musculoskeletal: Normal range of motion.  Lymphadenopathy:    She has no cervical adenopathy.  Neurological: She is alert. She has normal reflexes. No cranial nerve deficit. She exhibits normal muscle tone. Coordination normal.  Skin: Skin is warm and dry. No rash noted. No erythema. No pallor.  Nodular lesions on her face which in the past we've frozen with liquid nitrogen which seems to be the only effective therapy. She declines any therapy at this juncture  Psychiatric: She has a normal mood and affect. Her behavior is normal. Judgment and thought content normal.  Nursing note and vitals reviewed.         Assessment & Plan:  Healthy female  History of adult ADD continue Ritalin  Recommend routine eye care dental care and annual mammography starting now

## 2015-01-15 NOTE — Addendum Note (Signed)
Addended by: Kern ReapVEREEN, Hildreth Robart B on: 01/15/2015 12:35 PM   Modules accepted: Orders

## 2015-01-19 LAB — CYTOLOGY - PAP

## 2015-04-21 ENCOUNTER — Telehealth: Payer: Self-pay | Admitting: Family Medicine

## 2015-04-21 DIAGNOSIS — F988 Other specified behavioral and emotional disorders with onset usually occurring in childhood and adolescence: Secondary | ICD-10-CM

## 2015-04-21 NOTE — Telephone Encounter (Signed)
Pt needs new rx ritalin 10 mg °

## 2015-04-22 MED ORDER — METHYLPHENIDATE HCL 10 MG PO TABS
ORAL_TABLET | ORAL | Status: DC
Start: 2015-04-22 — End: 2015-08-07

## 2015-04-22 MED ORDER — METHYLPHENIDATE HCL 10 MG PO TABS: 10.0000 mg | ORAL_TABLET | Freq: Three times a day (TID) | ORAL | Status: DC

## 2015-04-22 NOTE — Telephone Encounter (Signed)
Left message on machine for patient that Rx ready for pick up. 

## 2015-08-05 ENCOUNTER — Telehealth: Payer: Self-pay | Admitting: Family Medicine

## 2015-08-05 DIAGNOSIS — F988 Other specified behavioral and emotional disorders with onset usually occurring in childhood and adolescence: Secondary | ICD-10-CM

## 2015-08-05 NOTE — Telephone Encounter (Signed)
Pt needs her Ritalin rx refilled please for a 3 month supply.

## 2015-08-07 MED ORDER — METHYLPHENIDATE HCL 10 MG PO TABS: 10.0000 mg | ORAL_TABLET | Freq: Three times a day (TID) | ORAL | Status: DC

## 2015-08-07 MED ORDER — METHYLPHENIDATE HCL 10 MG PO TABS: ORAL_TABLET | ORAL | Status: DC

## 2015-08-07 NOTE — Telephone Encounter (Signed)
rx ready for pick up and patient is aware  

## 2015-11-13 ENCOUNTER — Telehealth: Payer: Self-pay | Admitting: Family Medicine

## 2015-11-13 DIAGNOSIS — F988 Other specified behavioral and emotional disorders with onset usually occurring in childhood and adolescence: Secondary | ICD-10-CM

## 2015-11-13 MED ORDER — METHYLPHENIDATE HCL 10 MG PO TABS
ORAL_TABLET | ORAL | Status: DC
Start: 2015-11-13 — End: 2016-02-23

## 2015-11-13 MED ORDER — METHYLPHENIDATE HCL 10 MG PO TABS: 10.0000 mg | ORAL_TABLET | Freq: Three times a day (TID) | ORAL | Status: DC

## 2015-11-13 MED ORDER — METHYLPHENIDATE HCL 10 MG PO TABS
10.0000 mg | ORAL_TABLET | Freq: Three times a day (TID) | ORAL | Status: DC
Start: 2015-11-13 — End: 2016-02-23

## 2015-11-13 NOTE — Telephone Encounter (Signed)
Left message on machine for patient Rx ready for pick up.  Office visit for more refills.

## 2015-11-13 NOTE — Telephone Encounter (Signed)
Pt needs new rx ritalin 10 mg °

## 2016-02-22 ENCOUNTER — Other Ambulatory Visit: Payer: Self-pay | Admitting: Family Medicine

## 2016-02-22 DIAGNOSIS — F988 Other specified behavioral and emotional disorders with onset usually occurring in childhood and adolescence: Secondary | ICD-10-CM

## 2016-02-22 NOTE — Telephone Encounter (Signed)
Pt request refill  °methylphenidate (RITALIN) 10 MG tablet ° °3 mo supply °

## 2016-02-23 ENCOUNTER — Telehealth: Payer: Self-pay | Admitting: General Practice

## 2016-02-23 MED ORDER — METHYLPHENIDATE HCL 10 MG PO TABS: 10.0000 mg | ORAL_TABLET | Freq: Three times a day (TID) | ORAL | Status: DC

## 2016-02-23 MED ORDER — METHYLPHENIDATE HCL 10 MG PO TABS
10.0000 mg | ORAL_TABLET | Freq: Three times a day (TID) | ORAL | Status: DC
Start: 2016-02-23 — End: 2016-05-24

## 2016-02-23 NOTE — Telephone Encounter (Signed)
Okay to refill Please schedule follow-up with Dr. Tawanna Coolerodd in the fall

## 2016-02-23 NOTE — Telephone Encounter (Signed)
Left message on voicemail Rx's ready for pickup. Rx's printed and signed by Dr. Bonnetta BarryKwiatkowksi.

## 2016-02-24 NOTE — Telephone Encounter (Signed)
Rx printed. See other note.

## 2016-04-21 ENCOUNTER — Ambulatory Visit (INDEPENDENT_AMBULATORY_CARE_PROVIDER_SITE_OTHER): Payer: Self-pay | Admitting: Family Medicine

## 2016-04-21 ENCOUNTER — Encounter: Payer: Self-pay | Admitting: Family Medicine

## 2016-04-21 VITALS — BP 140/88 | HR 92 | Temp 98.8°F | Ht 64.0 in | Wt 106.5 lb

## 2016-04-21 DIAGNOSIS — J309 Allergic rhinitis, unspecified: Secondary | ICD-10-CM

## 2016-04-21 DIAGNOSIS — Z7189 Other specified counseling: Secondary | ICD-10-CM

## 2016-04-21 DIAGNOSIS — R030 Elevated blood-pressure reading, without diagnosis of hypertension: Secondary | ICD-10-CM | POA: Insufficient documentation

## 2016-04-21 DIAGNOSIS — Z7689 Persons encountering health services in other specified circumstances: Secondary | ICD-10-CM

## 2016-04-21 NOTE — Progress Notes (Signed)
Pre visit review using our clinic review tool, if applicable. No additional management support is needed unless otherwise documented below in the visit note. 

## 2016-04-21 NOTE — Patient Instructions (Signed)
It was a pleasure meeting you today! Please schedule a physical at your convenience.  Omron blood pressure cuff is what we discussed today. Please monitor your blood pressure and bring cuff with blood pressure readings to your physical appointment.   Minimal Blood Pressure Goal= AVERAGE < 140/90; Ideal is an AVERAGE < 135/85. This AVERAGE should be calculated from @ least 5-7 BP readings taken @ different times of day on different days of week. You should not respond to isolated BP readings , but rather the AVERAGE for that week .Please bring your blood pressure cuff to office visits to verify that it is reliable.It can also be checked against the blood pressure device at the pharmacy. Finger or wrist cuffs are not dependable; an arm cuff is.

## 2016-04-21 NOTE — Progress Notes (Signed)
Patient ID: LANEYA GASAWAY, female   DOB: 01/19/1971, 45 y.o.   MRN: [id number]  Patient presents to clinic today to establish care.  Acute Concerns:  Patient reports sinus pressure/pain for 1 day. Associated symptom ear pressure, rhinitis, congestion, post nasal drip, and hyponasal speech. Ibuprofen and Alka Seltzer cold medicine provided moderate benefit.  Smokes 3 cigarettes/day for one month which is decreased from 1/2 pack per day. She has a history of seasonal allergies and currently works with horses on a daily basis. She treats allergic rhinitis with OTC claritin with moderate benefit. She denies recent sick contact or recent antibiotic therapy. She denies fever, chills, and sweats.  No aggravating or alleviating factors are noted.  Chronic Issues:   ADD:  Ritalin 10 mg BID however this has been prescribed by her previous provider as TID but patient states that she does not need this TID and BID provides benefit for her.  She denies chest pain, palpitations, SOB, dyspnea, decreased appetite, sleep disturbance, or anxiety with this medication.  Diet: Modified heart healthy diet with limited salt.   Exercise:  Physical activity occurs throughout the day with her job and she denies cardiopulmonary symptoms with activity.  Elevated Blood pressure noted today of 144/90. She does not have a history of HTN and does not monitor her BP at home. She does report taking her Ritalin prior to this appointment and cold medication which may have influenced her BP. Retake of BP is noted as 140/88 and heart rate of 92. She denies chest pain, palpitations, SOB, dyspnea, numbness, tingling, weakness, headaches, nosebleed, and edema.    Health Maintenance: Dental -- Does not see dentist. Recommend visit [facility] -- Goes every 2 years, wears contacts Immunizations -- Td in 2011. Discussed Tdap for next immunization Colonoscopy -- Not needed Mammogram -- Has not had a mammogram; mother has history of  Breast cancer. She will set up a physical soon PAP -- Will schedule a physical. Last PAP 2014  LMP: Today and she denies any unusual vaginal discharge or symptoms.   No past medical history on file.  Past Surgical History  Procedure Laterality Date  . Cesarean section      Current Outpatient Prescriptions on File Prior to Visit  Medication Sig Dispense Refill  . methylphenidate (RITALIN) 10 MG tablet Take 1 tablet (10 mg total) by mouth 3 (three) times daily. 90 tablet 0  . methylphenidate (RITALIN) 10 MG tablet Take 1 tablet (10 mg total) by mouth 3 (three) times daily. 90 tablet 0  . Multiple Vitamin (MULTIVITAMIN) tablet Take 1 tablet by mouth daily.      . methylphenidate (RITALIN) 10 MG tablet Take 1 tablet (10 mg total) by mouth 3 (three) times daily. 90 tablet 0   No current facility-administered medications on file prior to visit.    Allergies  Allergen Reactions  . Bupropion Hcl     REACTION: rash  . Penicillins     REACTION: rash    No family history on file.  Social History   Social History  . Marital Status: Married    Spouse Name: N/A  . Number of Children: N/A  . Years of Education: N/A   Occupational History  . Not on file.   Social History Main Topics  . Smoking status: Current Every Day Smoker  . Smokeless tobacco: Not on file  . Alcohol Use: Yes  . Drug Use: No  . Sexual Activity: Not on file   Other Topics Concern  .  Not on file   Social History Narrative    Review of Systems  Constitutional: Negative for fever and chills.  HENT: Positive for congestion. Negative for sore throat.   Eyes: Negative for discharge and redness.  Respiratory: Negative for cough and wheezing.   Cardiovascular: Negative for chest pain, palpitations, claudication and leg swelling.  Gastrointestinal: Negative for heartburn, nausea, vomiting and abdominal pain.  Genitourinary: Negative for dysuria, urgency and frequency.  Musculoskeletal: Negative for myalgias  and back pain.  Skin: Negative for itching and rash.  Neurological: Negative for dizziness, tingling, sensory change, speech change, weakness and headaches.  Endo/Heme/Allergies: Negative for polydipsia.  Psychiatric/Behavioral:       Denies depressed or anxious mood      BP 144/90 mmHg  Pulse 101  Temp(Src) 98.8 F (37.1 C) (Oral)  Ht 5\' 4"  (1.626 m)  Wt 106 lb 8 oz (48.308 kg)  BMI 18.27 kg/m2  SpO2 97%  LMP 04/21/2016  Physical Exam  Constitutional: She is oriented to person, place, and time and well-developed, well-nourished, and in no distress.  HENT:  Right Ear: Tympanic membrane and external ear normal.  Left Ear: Tympanic membrane and external ear normal.  Nose: Rhinorrhea present. Right sinus exhibits no maxillary sinus tenderness and no frontal sinus tenderness. Left sinus exhibits no maxillary sinus tenderness and no frontal sinus tenderness.  Mouth/Throat: Mucous membranes are normal. No oropharyngeal exudate or posterior oropharyngeal erythema.  Erythematous nasal membranes  Eyes: Conjunctivae are normal. Pupils are equal, round, and reactive to light. No scleral icterus.  Neck: Neck supple.  Cardiovascular: Normal rate, regular rhythm and intact distal pulses.   Pulmonary/Chest: Effort normal and breath sounds normal. She has no wheezes. She has no rales.  Abdominal: Soft. Bowel sounds are normal. There is no tenderness.  Musculoskeletal: She exhibits no edema.  Lymphadenopathy:    She has no cervical adenopathy.  Neurological: She is alert and oriented to person, place, and time. Gait normal. Coordination normal.  Skin: Skin is warm and dry. No rash noted.  Psychiatric: Mood, memory, affect and judgment normal.    Assessment/Plan:  1. Elevated blood pressure reading without diagnosis of hypertension Suspect blood pressure elevation related to OTC cold medication and ritalin combination today. Advised patient to avoid OTC decongestants and discussed monitoring  her blood pressure once daily for 2 weeks and schedule a follow physical and an appointment for her BP to be reevaluated. Provided BP parameters for her and recommended an arm cuff. She will bring documented readings with her to her next visit and will follow up sooner if other symptoms present.  2. Allergic rhinitis, unspecified allergic rhinitis type  Allegra, Claritin, or Zyrtec can be used with flonase for symptoms of allergic rhinitis.   3. Encounter to establish care  Discussed refills for Ritalin and advised a follow up evaluation for ADD to address symptoms and future therapeutic management. Refill will be provided and she will schedule an evaluation as indicated above. Follow up for a physical and lab work within the next 3 months. Follow up for BP evaluation and management in 2 weeks.  Roddie McJulia Ozan Maclay, FNP-C

## 2016-05-23 ENCOUNTER — Telehealth: Payer: Self-pay | Admitting: Family Medicine

## 2016-05-23 NOTE — Telephone Encounter (Signed)
What we just discussed  

## 2016-05-23 NOTE — Telephone Encounter (Signed)
Pt request refill  °methylphenidate (RITALIN) 10 MG tablet °

## 2016-05-23 NOTE — Telephone Encounter (Signed)
Please call patient and confirm frequency of medication. My last note states that she reported taking this medication twice daily instead of 3 times daily. She has a follow up appointment in 2 months. I will provide this medication for 2 months after frequency is determined and she will need to keep this next appointment to discuss refills and use of this medication long term.

## 2016-05-24 ENCOUNTER — Other Ambulatory Visit: Payer: Self-pay | Admitting: Emergency Medicine

## 2016-05-24 DIAGNOSIS — F988 Other specified behavioral and emotional disorders with onset usually occurring in childhood and adolescence: Secondary | ICD-10-CM

## 2016-05-24 MED ORDER — METHYLPHENIDATE HCL 10 MG PO TABS: 10.0000 mg | ORAL_TABLET | Freq: Three times a day (TID) | ORAL | 0 refills | Status: DC

## 2016-05-24 NOTE — Telephone Encounter (Signed)
Called and spoke with pt, informed her prescriptions were printed and awaiting Julia's signature and I will contact her accordingly. Pt verbalized understanding.

## 2016-05-24 NOTE — Telephone Encounter (Signed)
Pt is taking med 3 times daily

## 2016-05-24 NOTE — Telephone Encounter (Signed)
Called and left message for pt to return call to office.  

## 2016-05-24 NOTE — Telephone Encounter (Signed)
Refill for 2 months and next prescription will be provided with follow up.

## 2016-05-26 NOTE — Telephone Encounter (Signed)
Called and made pt aware that prescriptions where signed and up front ready for pick up. Pt verbalized understanding.

## 2016-07-18 ENCOUNTER — Encounter: Payer: Self-pay | Admitting: Family Medicine

## 2016-07-18 DIAGNOSIS — Z0289 Encounter for other administrative examinations: Secondary | ICD-10-CM

## 2016-08-12 ENCOUNTER — Telehealth: Payer: Self-pay | Admitting: Family Medicine

## 2016-08-12 NOTE — Telephone Encounter (Signed)
Pt request refill  methylphenidate (RITALIN) 10 MG tablet  3 mo supply

## 2016-08-15 NOTE — Telephone Encounter (Signed)
Spoke to patient, verbalized understanding that she need to make an appointment to be seen by Timoteo AceJulia  Kordsmeire FNP before filling her Ritalin 10 mg Rx.

## 2016-08-18 ENCOUNTER — Ambulatory Visit (INDEPENDENT_AMBULATORY_CARE_PROVIDER_SITE_OTHER): Payer: Self-pay | Admitting: Family Medicine

## 2016-08-18 ENCOUNTER — Encounter: Payer: Self-pay | Admitting: Family Medicine

## 2016-08-18 VITALS — BP 118/78 | HR 98 | Temp 98.2°F | Wt 109.2 lb

## 2016-08-18 DIAGNOSIS — F988 Other specified behavioral and emotional disorders with onset usually occurring in childhood and adolescence: Secondary | ICD-10-CM

## 2016-08-18 MED ORDER — METHYLPHENIDATE HCL 10 MG PO TABS: 10.0000 mg | ORAL_TABLET | Freq: Three times a day (TID) | ORAL | 0 refills | Status: DC

## 2016-08-18 NOTE — Progress Notes (Signed)
Pre visit review using our clinic review tool, if applicable. No additional management support is needed unless otherwise documented below in the visit note. 

## 2016-08-18 NOTE — Patient Instructions (Signed)
Please follow up for a physical and lab work at your convenience.   It was a pleasure to see you today.

## 2016-08-18 NOTE — Progress Notes (Addendum)
Subjective:    Patient ID: Brenda BealsMichelle T Lyons, female    DOB: 04/22/1971, 45 y.o.   MRN: [id number]  HPI  Ms. Brenda Lyons is a 45 year old female who is here today for evaluation of her ADD.  She reports taking Ritalin 10 mg TID that has provided great benefit.  She denies chest pain, palpitations, SOB, headaches, numbness, tingling, weakness, decreased appetite, sleep disturbance, anxiety,and nosebleeds.  She is due for a physical and lab work. She reports that she will schedule this soon. She reports separating from her spouse due to a "stressful" situation however reports that she is in a "much better place". She denies any fear for herself. 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  She continues to smoke 5 cigarettes/day.  She is not interested in tobacco cessation today.  We discussed that when she becomes interested in smoking cessation, she can contact this office for assistance if needed.   Review of Systems  Constitutional: Negative for chills, fatigue and fever.  HENT: Negative for postnasal drip and rhinorrhea.   Eyes: Negative for visual disturbance.  Respiratory: Negative for cough, shortness of breath and wheezing.   Cardiovascular: Negative for chest pain and palpitations.  Gastrointestinal: Negative for abdominal pain, constipation, diarrhea, nausea and vomiting.  Genitourinary: Negative for dysuria, frequency and hematuria.  Musculoskeletal: Negative for myalgias.  Neurological: Negative for dizziness, weakness, light-headedness, numbness and headaches.  Psychiatric/Behavioral:       Denies depressed or anxious mood   Past Medical History:  Diagnosis Date  . ADD (attention deficit disorder)      Social History   Social History  . Marital status: Married    Spouse name: N/A  . Number of children: 3  . Years of education: N/A   Occupational History  . Not on file.   Social History Main Topics  . Smoking status:  Current Every Day Smoker    Packs/day: 0.50    Years: 8.00    Types: Cigarettes  . Smokeless tobacco: Not on file  . Alcohol use 1.2 oz/week    2 Standard drinks or equivalent per week  . Drug use: No  . Sexual activity: Not on file   Other Topics Concern  . Not on file   Social History Narrative  . No narrative on file    Past Surgical History:  Procedure Laterality Date  . CESAREAN SECTION      Family History  Problem Relation Age of Onset  . Breast cancer Mother [id number]  . Hypothyroidism Sister 40    Allergies  Allergen Reactions  . Bupropion Hcl     REACTION: rash  . Penicillins     REACTION: rash    Current Outpatient Prescriptions on File Prior to Visit  Medication Sig Dispense Refill  . Multiple Vitamin (MULTIVITAMIN) tablet Take 1 tablet by mouth daily.       No current facility-administered medications on file prior to visit.     BP 118/78 (BP Location: Left Arm, Patient Position: Sitting, Cuff Size: Normal)   Pulse 98   Temp 98.2 F (36.8 C) (Oral)   Wt 109 lb 3.2 oz (49.5 kg)   SpO2 99%   BMI 18.74 kg/m        Objective:   Physical Exam  Constitutional: She is oriented to person, place, and time. She appears well-developed and well-nourished.  Eyes: Pupils are equal, round, and reactive to light. No scleral icterus.  Neck: Neck supple.  Cardiovascular: Normal rate and regular rhythm.   Pulmonary/Chest: Effort normal and breath sounds normal. She has no wheezes. She has no rales.  Abdominal: Soft. Bowel sounds are normal. There is no tenderness.  Lymphadenopathy:    She has no cervical adenopathy.  Neurological: She is alert and oriented to person, place, and time. Coordination normal.  Skin: Skin is warm and dry. No rash noted.  Psychiatric: She has a normal mood and affect. Her behavior is normal. Judgment and thought content normal.        Assessment & Plan:  1. Attention deficit disorder, unspecified hyperactivity presence Stable;  continue regimen. [place] Substance Abuse Database checked. No concerning activity noted. Advised patient that she will need to set up a physical and lab work for future refills to be provided.  She voiced understanding and agreed with plan. - methylphenidate (RITALIN) 10 MG tablet; Take 1 tablet (10 mg total) by mouth 3 (three) times daily.  Dispense: 90 tablet; Refill: 0 - methylphenidate (RITALIN) 10 MG tablet; Take 1 tablet (10 mg total) by mouth 3 (three) times daily.  Dispense: 90 tablet; Refill: 0 - methylphenidate (RITALIN) 10 MG tablet; Take 1 tablet (10 mg total) by mouth 3 (three) times daily.  Dispense: 90 tablet; Refill: 0  Roddie McJulia Nayzeth Altman, FNP-C

## 2016-11-15 ENCOUNTER — Telehealth: Payer: Self-pay | Admitting: Family Medicine

## 2016-11-15 NOTE — Telephone Encounter (Signed)
Pt need new Rx for Ritalin  Pt is aware of 3 business days for refills.

## 2016-11-16 NOTE — Telephone Encounter (Signed)
Per her last visit in 08/2016 she will need to set up a physical and lab work for refills of this medication.

## 2016-11-17 NOTE — Telephone Encounter (Signed)
Called patient and explained that Brenda Lyons is recommending for her to set up an appointment for physical and lab work for refill of her Ritalin medication. Patient verbalized understanding and stated that she was going to schedule appointment for next week.

## 2016-11-21 NOTE — Telephone Encounter (Signed)
Thank you for helping make the  Appointment.

## 2016-11-21 NOTE — Telephone Encounter (Signed)
Pt scheduled  

## 2016-11-25 ENCOUNTER — Ambulatory Visit (INDEPENDENT_AMBULATORY_CARE_PROVIDER_SITE_OTHER): Payer: Self-pay | Admitting: Family Medicine

## 2016-11-25 ENCOUNTER — Encounter: Payer: Self-pay | Admitting: Family Medicine

## 2016-11-25 VITALS — BP 118/88 | HR 84 | Temp 98.6°F | Wt 108.8 lb

## 2016-11-25 DIAGNOSIS — F988 Other specified behavioral and emotional disorders with onset usually occurring in childhood and adolescence: Secondary | ICD-10-CM

## 2016-11-25 DIAGNOSIS — Z0000 Encounter for general adult medical examination without abnormal findings: Secondary | ICD-10-CM

## 2016-11-25 LAB — CBC WITH DIFFERENTIAL/PLATELET
BASOS PCT: 0.8 % (ref 0.0–3.0)
Basophils Absolute: 0.1 10*3/uL (ref 0.0–0.1)
EOS PCT: 3.2 % (ref 0.0–5.0)
Eosinophils Absolute: 0.2 10*3/uL (ref 0.0–0.7)
HCT: 39.1 % (ref 36.0–46.0)
HEMOGLOBIN: 13.1 g/dL (ref 12.0–15.0)
Lymphocytes Relative: 20.4 % (ref 12.0–46.0)
Lymphs Abs: 1.4 10*3/uL (ref 0.7–4.0)
MCHC: 33.4 g/dL (ref 30.0–36.0)
MCV: 95 fl (ref 78.0–100.0)
MONO ABS: 0.4 10*3/uL (ref 0.1–1.0)
MONOS PCT: 6.6 % (ref 3.0–12.0)
Neutro Abs: 4.6 10*3/uL (ref 1.4–7.7)
Neutrophils Relative %: 69 % (ref 43.0–77.0)
Platelets: 232 10*3/uL (ref 150.0–400.0)
RBC: 4.11 Mil/uL (ref 3.87–5.11)
RDW: 12.8 % (ref 11.5–15.5)
WBC: 6.6 10*3/uL (ref 4.0–10.5)

## 2016-11-25 LAB — BASIC METABOLIC PANEL
BUN: 15 mg/dL (ref 6–23)
CALCIUM: 9.4 mg/dL (ref 8.4–10.5)
CO2: 27 mEq/L (ref 19–32)
CREATININE: 0.75 mg/dL (ref 0.40–1.20)
Chloride: 106 mEq/L (ref 96–112)
GFR: 88.3 mL/min (ref >60.00)
GLUCOSE: 97 mg/dL (ref 70–99)
Potassium: 4.2 mEq/L (ref 3.5–5.1)
SODIUM: 137 meq/L (ref 135–145)

## 2016-11-25 LAB — HEPATIC FUNCTION PANEL
ALBUMIN: 4.3 g/dL (ref 3.5–5.2)
ALK PHOS: 56 U/L (ref 39–117)
ALT: 20 U/L (ref 0–35)
AST: 20 U/L (ref 0–37)
Bilirubin, Direct: 0.1 mg/dL (ref 0.0–0.3)
TOTAL PROTEIN: 7 g/dL (ref 6.0–8.3)
Total Bilirubin: 0.5 mg/dL (ref 0.2–1.2)

## 2016-11-25 MED ORDER — METHYLPHENIDATE HCL 10 MG PO TABS: 10.0000 mg | ORAL_TABLET | Freq: Three times a day (TID) | ORAL | 0 refills | Status: DC

## 2016-11-25 NOTE — Progress Notes (Signed)
Subjective:    Patient ID: Brenda Lyons, female    DOB: January 14, 1971, 46 y.o.   MRN: [id number]  HPI   Patient presents to clinic today for annual exam.  Patient is fasting for labs.   Chronic Issues:  ADD:  Ritalin 10 mg TID has provided great benefit.  She denies chest pain, palpitations, SOB, headaches, numbness, tingling, weakness, decreased appetite, sleep disturbance, anxiety, and nosebleeds. 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  She continues to smoke 5 cigarettes/day.  She is not interested in tobacco cessation today.    Health Maintenance: Dental -- Needs an appointment [facility] -- Yearly; she wears contacts Immunizations --Declines influenza vaccine Colonoscopy -- Not needed Mammogram -- Has not completed due to lack of health insurance; needed PAP --Normal in 2016   Past Medical History:  Diagnosis Date  . ADD (attention deficit disorder)     Past Surgical History:  Procedure Laterality Date  . CESAREAN SECTION      Current Outpatient Prescriptions on File Prior to Visit  Medication Sig Dispense Refill  . Multiple Vitamin (MULTIVITAMIN) tablet Take 1 tablet by mouth daily.      . methylphenidate (RITALIN) 10 MG tablet Take 1 tablet (10 mg total) by mouth 3 (three) times daily. 90 tablet 0   No current facility-administered medications on file prior to visit.     Allergies  Allergen Reactions  . Bupropion Hcl     REACTION: rash  . Penicillins     REACTION: rash    Family History  Problem Relation Age of Onset  . Breast cancer Mother 39  . Hypothyroidism Sister 68    Social History   Social History  . Marital status: Married    Spouse name: N/A  . Number of children: 3  . Years of education: N/A   Occupational History  . Not on file.   Social History Main Topics  . Smoking status: Current Every Day Smoker    Packs/day: 0.50    Years: 8.00    Types: Cigarettes  . Smokeless tobacco:  Not on file  . Alcohol use 1.2 oz/week    2 Standard drinks or equivalent per week  . Drug use: No  . Sexual activity: Not on file   Other Topics Concern  . Not on file   Social History Narrative  . No narrative on file    ROS  Temp 98.6 F (37 C) (Oral)   Wt 108 lb 12.8 oz (49.4 kg)   BMI 18.68 kg/m  Constitutional: No fever, chills, significant weight change, fatigue, weakness or night sweats Eyes: No redness, discharge, pain, blurred vision, double vision, or loss of vision ENT/mouth: No nasal congestion, postnasal drainage,epistaxis, purulent discharge, earache, hearing loss, tinnitus ,sore throat , dental pain, or hoarseness   Cardiovascular: no chest pain, palpitations, racing, irregular rhythm, syncope, nausea, sweating, claudication, or edema  Respiratory: No cough, sputum production,hemoptysis,  dyspnea, paroxysmal nocturnal dyspnea, pleuritic chest pain, significant snoring, or  apnea    Gastrointestinal: No heartburn,dysphagia, nausea and vomiting,ominal pain, change in bowels, anorexia, diarrhea, significant constipation, rectal bleeding, melena,  stool incontinence or jaundice Genitourinary: No dysuria,hematuria, pyuria, frequency, urgency,  incontinence, nocturia, dark urine or flank pain Musculoskeletal: No myalgias or muscle cramping, joint stiffness, joint swelling, joint color change, weakness, or cyanosis Dermatologic: No rash, pruritus, urticaria, or change in color or temperature of skin Neurologic: No headache, vertigo, limb weakness, tremor, gait disturbance,  seizures, memory loss, numbness or tingling Psychiatric: No significant anxiety or depression, anhedonia, panic attacks, insomnia, or anorexia Endocrine: No change in hair/skin/ nails, excessive thirst, excessive hunger, excessive urination, or unexplained fatigue Hematologic/lymphatic: No bruising, lymphadenopathy,or  abnormal clotting Allergy/immunology: No itchy/ watery eyes, abnormal sneezing,  rhinitis, urticaria ,or angioedema  Past Medical History:  Diagnosis Date  . ADD (attention deficit disorder)      Social History   Social History  . Marital status: Married    Spouse name: N/A  . Number of children: 3  . Years of education: N/A   Occupational History  . Not on file.   Social History Main Topics  . Smoking status: Current Every Day Smoker    Packs/day: 0.50    Years: 8.00    Types: Cigarettes  . Smokeless tobacco: Never Used  . Alcohol use 1.2 oz/week    2 Standard drinks or equivalent per week  . Drug use: No  . Sexual activity: Not on file   Other Topics Concern  . Not on file   Social History Narrative  . No narrative on file    Past Surgical History:  Procedure Laterality Date  . CESAREAN SECTION      Family History  Problem Relation Age of Onset  . Breast cancer Mother 64  . Hypothyroidism Sister 40    Allergies  Allergen Reactions  . Bupropion Hcl     REACTION: rash  . Penicillins     REACTION: rash    Current Outpatient Prescriptions on File Prior to Visit  Medication Sig Dispense Refill  . Multiple Vitamin (MULTIVITAMIN) tablet Take 1 tablet by mouth daily.       No current facility-administered medications on file prior to visit.     BP 118/88 (BP Location: Left Arm, Patient Position: Sitting, Cuff Size: Normal)   Pulse 84   Temp 98.6 F (37 C) (Oral)   Wt 108 lb 12.8 oz (49.4 kg)   LMP 11/05/2016 (Approximate)   SpO2 99%   BMI 18.68 kg/m    Physical Exam Physical Exam  Constitutional: She is oriented to person, place, and time. She appears well-developed and well-nourished. No distress.  HENT:  Head: Normocephalic and atraumatic.  Right Ear: Tympanic membrane and ear canal normal.  Left Ear: Tympanic membrane and ear canal normal.  Mouth/Throat: Oropharynx is clear and moist.  Eyes: Pupils are equal, round, and reactive to light. No scleral icterus.  Neck: Normal range of motion. No thyromegaly present.    Cardiovascular: Normal rate and regular rhythm.   No murmur heard. Pulmonary/Chest: Effort normal and breath sounds normal. No respiratory distress. He has no wheezes. She has no rales. She exhibits no tenderness.  Abdominal: Soft. Bowel sounds are normal. She exhibits no distension and no mass. There is no tenderness. There is no rebound and no guarding.  Musculoskeletal: She exhibits no edema.  Lymphadenopathy:    She has no cervical adenopathy.  Neurological: She is alert and oriented to person, place, and time. She has normal patellar reflexes. She exhibits normal muscle tone. Coordination normal.  Skin: Skin is warm and dry.  Psychiatric: She has a normal mood and affect. Her behavior is normal. Judgment and thought content normal.  GU exam deferred per patient request. Pap is due next year. Declined breast exam   Assessment/Plan:  Assessment/Plan:  1. Routine general medical examination at a health care facility 46 y.o. female presenting for annual physical.  Health Maintenance counseling: 1. Anticipatory guidance: Patient  counseled regarding regular dental exams, eye exams, wearing seatbelts.  2. Risk factor reduction:  Advised patient of need for regular exercise and diet rich and fruits and vegetables to reduce risk of heart attack and stroke.  3. Immunizations/screenings/ancillary studies: Declined Influenza vaccine Immunization History  Administered Date(s) Administered  . Influenza Whole 09/08/2010  . Influenza,inj,Quad PF,36+ Mos 07/15/2013  . Td 09/08/2010   Health Maintenance Due  Topic Date Due  . HIV Screening  07/31/1985   4. Cervical cancer screening- Due in 2019 5. Breast cancer screening-Declined today; referral for mammogram placed 6. Colon cancer screening - Not needed at this time 7. Skin cancer screening- No suspicious lesions noted on arms, legs, and back.  - CBC with Differential/Platelet - Basic metabolic panel - Hepatic function panel  2.  Attention deficit disorder, unspecified hyperactivity presence Stable; continue regimen.  Substance Abuse Database checked.  No concerning activity noted.  - methylphenidate (RITALIN) 10 MG tablet; Take 1 tablet (10 mg total) by mouth 3 (three) times daily.  Dispense: 90 tablet; Refill: 0 - methylphenidate (RITALIN) 10 MG tablet; Take 1 tablet (10 mg total) by mouth 3 (three) times daily.  Dispense: 90 tablet; Refill: 0 - methylphenidate (RITALIN) 10 MG tablet; Take 1 tablet (10 mg total) by mouth 3 (three) times daily.  Dispense: 90 tablet; Refill: 0  Provided number for scholarship program [facility] help with her mammogram. Directions provided and patient will contact facility for this service. Call made to facility to verify instructions.  Limited lab work has been ordered today. Patient declined other health preventive labs due to lack of health insurance.  Roddie McJulia Lewellyn Fultz, FNP-C      Review of Systems    See above Objective:   Physical Exam See above       Assessment & Plan:  See above

## 2016-11-25 NOTE — Progress Notes (Signed)
Pre visit review using our clinic review tool, if applicable. No additional management support is needed unless otherwise documented below in the visit note. an

## 2016-11-25 NOTE — Patient Instructions (Addendum)
It was a pleasure to see you today! Please keep up the great work with your diet and exercise!   There is a Water engineer for mammograms. Please contact this number, leave a message, and you will have your call returned.  [phone].  We have ordered labs or studies at this visit. It can take up to 1-2 weeks for results and processing. IF results require follow up or explanation, we will call you with instructions. Clinically stable results will be released to your [facility]. If you have not heard from [place] or cannot find your results in [facility] in 2 weeks please contact our office at [phone].  If you are not yet signed up for [facility], please consider signing up   Health Maintenance, Female Introduction Adopting a healthy lifestyle and getting preventive care can go a long way to promote health and wellness. Talk with your health care provider about what schedule of regular examinations is right for you. This is a good chance for you to check in with your provider about disease prevention and staying healthy. In between checkups, there are plenty of things you can do on your own. Experts have done a lot of research about which lifestyle changes and preventive measures are most likely to keep you healthy. Ask your health care provider for more information. Weight and diet Eat a healthy diet  Be sure to include plenty of vegetables, fruits, low-fat dairy products, and lean protein.  Do not eat a lot of foods high in solid fats, added sugars, or salt.  Get regular exercise. This is one of the most important things you can do for your health.  Most adults should exercise for at least 150 minutes each week. The exercise should increase your heart rate and make you sweat (moderate-intensity exercise).  Most adults should also do strengthening exercises at least twice a week. This is in addition to the moderate-intensity exercise. Maintain a healthy weight  Body mass index (BMI) is a  measurement that can be used to identify possible weight problems. It estimates body fat based on height and weight. Your health care provider can help determine your BMI and help you achieve or maintain a healthy weight.  For females 61 years of age and older:  A BMI below 18.5 is considered underweight.  A BMI of 18.5 to 24.9 is normal.  A BMI of 25 to 29.9 is considered overweight.  A BMI of 30 and above is considered obese. Watch levels of cholesterol and blood lipids  You should start having your blood tested for lipids and cholesterol at 47 years of age, then have this test every 5 years.  You may need to have your cholesterol levels checked more often if:  Your lipid or cholesterol levels are high.  You are older than 47 years of age.  You are at high risk for heart disease. Cancer screening Lung Cancer  Lung cancer screening is recommended for adults 22-7 years old who are at high risk for lung cancer because of a history of smoking.  A yearly low-dose CT scan of the lungs is recommended for people who:  Currently smoke.  Have quit within the past 15 years.  Have at least a 30-pack-year history of smoking. A pack year is smoking an average of one pack of cigarettes a day for 1 year.  Yearly screening should continue until it has been 15 years since you quit.  Yearly screening should stop if you develop a health problem that would  prevent you from having lung cancer treatment. Breast Cancer  Practice breast self-awareness. This means understanding how your breasts normally appear and feel.  It also means doing regular breast self-exams. Let your health care provider know about any changes, no matter how small.  If you are in your 20s or 30s, you should have a clinical breast exam (CBE) by a health care provider every 1-3 years as part of a regular health exam.  If you are 79 or older, have a CBE every year. Also consider having a breast X-ray (mammogram) every  year.  If you have a family history of breast cancer, talk to your health care provider about genetic screening.  If you are at high risk for breast cancer, talk to your health care provider about having an MRI and a mammogram every year.  Breast cancer gene (BRCA) assessment is recommended for women who have family members with BRCA-related cancers. BRCA-related cancers include:  Breast.  Ovarian.  Tubal.  Peritoneal cancers.  Results of the assessment will determine the need for genetic counseling and BRCA1 and BRCA2 testing. Cervical Cancer  Your health care provider may recommend that you be screened regularly for cancer of the pelvic organs (ovaries, uterus, and vagina). This screening involves a pelvic examination, including checking for microscopic changes to the surface of your cervix (Pap test). You may be encouraged to have this screening done every 3 years, beginning at age 55.  For women ages 43-65, health care providers may recommend pelvic exams and Pap testing every 3 years, or they may recommend the Pap and pelvic exam, combined with testing for human papilloma virus (HPV), every 5 years. Some types of HPV increase your risk of cervical cancer. Testing for HPV may also be done on women of any age with unclear Pap test results.  Other health care providers may not recommend any screening for nonpregnant women who are considered low risk for pelvic cancer and who do not have symptoms. Ask your health care provider if a screening pelvic exam is right for you.  If you have had past treatment for cervical cancer or a condition that could lead to cancer, you need Pap tests and screening for cancer for at least 20 years after your treatment. If Pap tests have been discontinued, your risk factors (such as having a new sexual partner) need to be reassessed to determine if screening should resume. Some women have medical problems that increase the chance of getting cervical cancer. In  these cases, your health care provider may recommend more frequent screening and Pap tests. Colorectal Cancer  This type of cancer can be detected and often prevented.  Routine colorectal cancer screening usually begins at 47 years of age and continues through 47 years of age.  Your health care provider may recommend screening at an earlier age if you have risk factors for colon cancer.  Your health care provider may also recommend using home test kits to check for hidden blood in the stool.  A small camera at the end of a tube can be used to examine your colon directly (sigmoidoscopy or colonoscopy). This is done to check for the earliest forms of colorectal cancer.  Routine screening usually begins at age 57.  Direct examination of the colon should be repeated every 5-10 years through 47 years of age. However, you may need to be screened more often if early forms of precancerous polyps or small growths are found. Skin Cancer  Check your skin from head  to toe regularly.  Tell your health care provider about any new moles or changes in moles, especially if there is a change in a mole's shape or color.  Also tell your health care provider if you have a mole that is larger than the size of a pencil eraser.  Always use sunscreen. Apply sunscreen liberally and repeatedly throughout the day.  Protect yourself by wearing long sleeves, pants, a wide-brimmed hat, and sunglasses whenever you are outside. Heart disease, diabetes, and high blood pressure  High blood pressure causes heart disease and increases the risk of stroke. High blood pressure is more likely to develop in:  People who have blood pressure in the high end of the normal range (130-139/85-89 mm Hg).  People who are overweight or obese.  People who are African American.  If you are 35-68 years of age, have your blood pressure checked every 3-5 years. If you are 76 years of age or older, have your blood pressure checked  every year. You should have your blood pressure measured twice-once when you are at a hospital or clinic, and once when you are not at a hospital or clinic. Record the average of the two measurements. To check your blood pressure when you are not at a hospital or clinic, you can use:  An automated blood pressure machine at a pharmacy.  A home blood pressure monitor.  If you are between 68 years and 20 years old, ask your health care provider if you should take aspirin to prevent strokes.  Have regular diabetes screenings. This involves taking a blood sample to check your fasting blood sugar level.  If you are at a normal weight and have a low risk for diabetes, have this test once every three years after 47 years of age.  If you are overweight and have a high risk for diabetes, consider being tested at a younger age or more often. Preventing infection Hepatitis B  If you have a higher risk for hepatitis B, you should be screened for this virus. You are considered at high risk for hepatitis B if:  You were born in a country where hepatitis B is common. Ask your health care provider which countries are considered high risk.  Your parents were born in a high-risk country, and you have not been immunized against hepatitis B (hepatitis B vaccine).  You have HIV or AIDS.  You use needles to inject street drugs.  You live with someone who has hepatitis B.  You have had sex with someone who has hepatitis B.  You get hemodialysis treatment.  You take certain medicines for conditions, including cancer, organ transplantation, and autoimmune conditions. Hepatitis C  Blood testing is recommended for:  Everyone born from [birth year] through 1966.  Anyone with known risk factors for hepatitis C. Sexually transmitted infections (STIs)  You should be screened for sexually transmitted infections (STIs) including gonorrhea and chlamydia if:  You are sexually active and are younger than 47 years of  age.  You are older than 47 years of age and your health care provider tells you that you are at risk for this type of infection.  Your sexual activity has changed since you were last screened and you are at an increased risk for chlamydia or gonorrhea. Ask your health care provider if you are at risk.  If you do not have HIV, but are at risk, it may be recommended that you take a prescription medicine daily to prevent HIV infection. This is  called pre-exposure prophylaxis (PrEP). You are considered at risk if:  You are sexually active and do not regularly use condoms or know the HIV status of your partner(s).  You take drugs by injection.  You are sexually active with a partner who has HIV. Talk with your health care provider about whether you are at high risk of being infected with HIV. If you choose to begin PrEP, you should first be tested for HIV. You should then be tested every 3 months for as long as you are taking PrEP. Pregnancy  If you are premenopausal and you may become pregnant, ask your health care provider about preconception counseling.  If you may become pregnant, take 400 to 800 micrograms (mcg) of folic acid every day.  If you want to prevent pregnancy, talk to your health care provider about birth control (contraception). Osteoporosis and menopause  Osteoporosis is a disease in which the bones lose minerals and strength with aging. This can result in serious bone fractures. Your risk for osteoporosis can be identified using a bone density scan.  If you are 50 years of age or older, or if you are at risk for osteoporosis and fractures, ask your health care provider if you should be screened.  Ask your health care provider whether you should take a calcium or vitamin D supplement to lower your risk for osteoporosis.  Menopause may have certain physical symptoms and risks.  Hormone replacement therapy may reduce some of these symptoms and risks. Talk to your health  care provider about whether hormone replacement therapy is right for you. Follow these instructions at home:  Schedule regular health, dental, and eye exams.  Stay current with your immunizations.  Do not use any tobacco products including cigarettes, chewing tobacco, or electronic cigarettes.  If you are pregnant, do not drink alcohol.  If you are breastfeeding, limit how much and how often you drink alcohol.  Limit alcohol intake to no more than 1 drink per day for nonpregnant women. One drink equals 12 ounces of beer, 5 ounces of wine, or 1 ounces of hard liquor.  Do not use street drugs.  Do not share needles.  Ask your health care provider for help if you need support or information about quitting drugs.  Tell your health care provider if you often feel depressed.  Tell your health care provider if you have ever been abused or do not feel safe at home. This information is not intended to replace advice given to you by your health care provider. Make sure you discuss any questions you have with your health care provider. Document Released: 04/04/2011 Document Revised: 02/25/2016 Document Reviewed: 06/23/2015  2017 [employer]vier

## 2017-02-21 ENCOUNTER — Telehealth: Payer: Self-pay | Admitting: Family Medicine

## 2017-02-21 NOTE — Telephone Encounter (Signed)
Pt need new Rx for methylphenidate    Pt is aware of 3 business days for refills and someone will call when ready for pick up.   °

## 2017-02-23 ENCOUNTER — Other Ambulatory Visit: Payer: Self-pay | Admitting: Family Medicine

## 2017-02-23 DIAGNOSIS — F988 Other specified behavioral and emotional disorders with onset usually occurring in childhood and adolescence: Secondary | ICD-10-CM

## 2017-02-23 MED ORDER — METHYLPHENIDATE HCL 10 MG PO TABS: 10.0000 mg | ORAL_TABLET | Freq: Three times a day (TID) | ORAL | 0 refills | Status: DC

## 2017-02-23 NOTE — Telephone Encounter (Signed)
Prescription printed and placed at your desk for patient to pick up

## 2017-02-23 NOTE — Progress Notes (Signed)
Prescription for 3 months has been supplied. Follow up in August which will be at 6 months for next refill.

## 2017-02-23 NOTE — Telephone Encounter (Signed)
Patient last visit was 11/30/16, Please advise if okey to refill her Ritalin

## 2017-02-24 NOTE — Progress Notes (Signed)
Called patient aware that the Rx is ready for pick up at the office.

## 2017-02-24 NOTE — Telephone Encounter (Signed)
Patient is aware to pick up the Rx in the office.

## 2017-05-22 ENCOUNTER — Encounter: Payer: Self-pay | Admitting: Family Medicine

## 2017-05-22 ENCOUNTER — Telehealth: Payer: Self-pay | Admitting: Family Medicine

## 2017-05-22 ENCOUNTER — Ambulatory Visit (INDEPENDENT_AMBULATORY_CARE_PROVIDER_SITE_OTHER): Payer: Self-pay | Admitting: Family Medicine

## 2017-05-22 VITALS — BP 112/78 | HR 103 | Temp 98.4°F | Ht 64.0 in | Wt 118.4 lb

## 2017-05-22 DIAGNOSIS — L723 Sebaceous cyst: Secondary | ICD-10-CM

## 2017-05-22 DIAGNOSIS — L03113 Cellulitis of right upper limb: Secondary | ICD-10-CM

## 2017-05-22 MED ORDER — CLINDAMYCIN HCL 300 MG PO CAPS: 300.0000 mg | ORAL_CAPSULE | Freq: Three times a day (TID) | ORAL | 0 refills | Status: AC

## 2017-05-22 NOTE — Progress Notes (Signed)
HPI:   Acute visit for knot on shoulder: -started several days ago -painful at times swollen bump -some warmth and erythema -hx inflamed cysts -did have itchy spot on shoulder adjacent to this area as well -denies fevers, malaise, chills, drainage  ROS: See pertinent positives and negatives per HPI.  Past Medical History:  Diagnosis Date  . ADD (attention deficit disorder)     Past Surgical History:  Procedure Laterality Date  . CESAREAN SECTION      Family History  Problem Relation Age of Onset  . Breast cancer Mother 22  . Hypothyroidism Sister 34    Social History   Social History  . Marital status: Married    Spouse name: N/A  . Number of children: 3  . Years of education: N/A   Social History Main Topics  . Smoking status: Current Every Day Smoker    Packs/day: 0.50    Years: 8.00    Types: Cigarettes  . Smokeless tobacco: Never Used  . Alcohol use 1.2 oz/week    2 Standard drinks or equivalent per week  . Drug use: No  . Sexual activity: Not Asked   Other Topics Concern  . None   Social History Narrative  . None     Current Outpatient Prescriptions:  .  methylphenidate (RITALIN) 10 MG tablet, Take 1 tablet (10 mg total) by mouth 3 (three) times daily., Disp: 90 tablet, Rfl: 0 .  methylphenidate (RITALIN) 10 MG tablet, Take 1 tablet (10 mg total) by mouth 3 (three) times daily., Disp: 90 tablet, Rfl: 0 .  Multiple Vitamin (MULTIVITAMIN) tablet, Take 1 tablet by mouth daily.  , Disp: , Rfl:  .  clindamycin (CLEOCIN) 300 MG capsule, Take 1 capsule (300 mg total) by mouth 3 (three) times daily., Disp: 15 capsule, Rfl: 0 .  methylphenidate (RITALIN) 10 MG tablet, Take 1 tablet (10 mg total) by mouth 3 (three) times daily., Disp: 90 tablet, Rfl: 0  EXAM:  Vitals:   05/22/17 1302  BP: 112/78  Pulse: (!) 103  Temp: 98.4 F (36.9 C)    Body mass index is 20.32 kg/m.  GENERAL: vitals reviewed and listed above, alert, oriented, appears well  hydrated and in no acute distress  HEENT: atraumatic, conjunttiva clear, no obvious abnormalities on inspection of external nose and ears  NECK: no obvious masses on inspection, no meningeal signs  LUNGS: clear to auscultation bilaterally, no wheezes, rales or rhonchi, good air movement  CV: HRRR, no peripheral edema  SKIN: soft rubbery, spherical, mobile subcut mass L shoulder approx 1.5-2 cm in diameter with some surrounding erythema, warmth and edema;3 insect bites on R shoulder - no signs infection at these sites  MS: moves all extremities without noticeable abnormality  PSYCH: pleasant and cooperative, no obvious depression or anxiety  ASSESSMENT AND PLAN:  Discussed the following assessment and plan:  Inflamed sebaceous cyst  Cellulitis of right upper extremity   -we discussed possible serious and likely etiologies, workup and treatment, treatment risks and return precautions - query inflamed cyst, possible cellulitis vs other  -after this discussion, Brenda Lyons opted for trial oral abx and compresses as prefers to avoid surgical/procedural intervention if possible - she did agree to seek immediately care if worsening, spreading redness or swelling, fevers or not improving with treatment and did explain at that point would need I and D and/or [place], etc -follow up advised prn -of course, we advised Brenda Lyons  to return or notify a doctor immediately if symptoms worsen  or persist or new concerns arise.    Patient Instructions  Go get the antibiotic and start this today.  Compresses several times daily.  Seek emergency care if worsening redness or swelling, fevers, getting worse instead of better or not improving over the next 24 hours.   Brenda Lyons R., DO

## 2017-05-22 NOTE — Patient Instructions (Signed)
Go get the antibiotic and start this today.  Compresses several times daily.  Seek emergency care if worsening redness or swelling, fevers, getting worse instead of better or not improving over the next 24 hours.

## 2017-05-22 NOTE — Telephone Encounter (Signed)
Pt need new Rx for RITALIN  Pt is aware of 3 business days for refills and someone will call when ready for pick up.

## 2017-05-23 NOTE — Telephone Encounter (Signed)
Pt is requesting for a new Rx for Ritalin which was last filled on 02/23/17 for # 90 tablets, pt was seen at the clinic yesterday by Dr Selena Batten. Please Advise.

## 2017-05-24 NOTE — Telephone Encounter (Signed)
She will need an office visit for this prescription refill as she was seen for another issue by Dr. Selena Batten.

## 2017-05-25 NOTE — Telephone Encounter (Signed)
Attempted to call pt, left a message to return my call in the office. 

## 2017-05-26 ENCOUNTER — Encounter: Payer: Self-pay | Admitting: Family Medicine

## 2017-05-26 ENCOUNTER — Ambulatory Visit (INDEPENDENT_AMBULATORY_CARE_PROVIDER_SITE_OTHER): Payer: Self-pay | Admitting: Family Medicine

## 2017-05-26 VITALS — BP 122/72 | Temp 99.0°F | Ht 64.0 in | Wt 118.6 lb

## 2017-05-26 DIAGNOSIS — L723 Sebaceous cyst: Secondary | ICD-10-CM

## 2017-05-26 DIAGNOSIS — F988 Other specified behavioral and emotional disorders with onset usually occurring in childhood and adolescence: Secondary | ICD-10-CM

## 2017-05-26 DIAGNOSIS — L03113 Cellulitis of right upper limb: Secondary | ICD-10-CM

## 2017-05-26 MED ORDER — METHYLPHENIDATE HCL 10 MG PO TABS: 10.0000 mg | ORAL_TABLET | Freq: Three times a day (TID) | ORAL | 0 refills | Status: DC

## 2017-05-26 MED ORDER — METHYLPHENIDATE HCL 10 MG PO TABS
10.0000 mg | ORAL_TABLET | Freq: Three times a day (TID) | ORAL | 0 refills | Status: DC
Start: 2017-05-26 — End: 2017-10-18

## 2017-05-26 NOTE — Telephone Encounter (Signed)
Pt is coming in today 8/24 to see Amil Amen. Will close this message.

## 2017-05-26 NOTE — Progress Notes (Signed)
Subjective:    Patient ID: Brenda Lyons, female    DOB: Feb 04, 1971, 46 y.o.   MRN: [id number]  HPI  Brenda Lyons is a 46 year old female who presents today for follow up of ADD.  ADD:  Ritalin 10 mg TID has provided great benefit for patient. She denies chest pain, palpitations, SOB, numbness, tingling, weakness, HAs, decreased appetite, sleep disturbance, or nosebleeds. She continues to work 2 jobs and states that this medication has provided benefit for her at work.   She continues to smoke approximately 5 cigarettes/day.  She is not interested in tobacco cessation at this time.   She reports that the knot on her shoulder has improved and cellulitis of right upper extremity has improved.  She denies fever, chills, sweats, or increased pain. She is taking Clindamycin which was prescribed for an inflamed sebaceous cyst. She denies any adverse effects and reports that symptoms have improved greatly.  Patient has a low grade temp of 63 F; however reports that she has been working outside and then drove here in a car without [facility] and states that she did get warm.  Review of Systems  Constitutional: Negative for chills and fatigue.  HENT: Negative for congestion, postnasal drip and rhinorrhea.   Respiratory: Negative for cough, shortness of breath and wheezing.   Cardiovascular: Negative for chest pain and palpitations.  Gastrointestinal: Negative for abdominal pain, constipation, diarrhea, nausea and vomiting.  Musculoskeletal: Negative for back pain and myalgias.  Skin: Negative for rash.  Neurological: Negative for dizziness, weakness, light-headedness, numbness and headaches.   Past Medical History:  Diagnosis Date  . ADD (attention deficit disorder)      Social History   Social History  . Marital status: Married    Spouse name: N/A  . Number of children: 3  . Years of education: N/A   Occupational History  . Not on file.   Social History Main Topics  . Smoking  status: Current Every Day Smoker    Packs/day: 0.50    Years: 8.00    Types: Cigarettes  . Smokeless tobacco: Never Used  . Alcohol use 1.2 oz/week    2 Standard drinks or equivalent per week  . Drug use: No  . Sexual activity: Not on file   Other Topics Concern  . Not on file   Social History Narrative  . No narrative on file    Past Surgical History:  Procedure Laterality Date  . CESAREAN SECTION      Family History  Problem Relation Age of Onset  . Breast cancer Mother 103  . Hypothyroidism Sister 40    Allergies  Allergen Reactions  . Bupropion Hcl     REACTION: rash  . Penicillins     REACTION: rash    Current Outpatient Prescriptions on File Prior to Visit  Medication Sig Dispense Refill  . clindamycin (CLEOCIN) 300 MG capsule Take 1 capsule (300 mg total) by mouth 3 (three) times daily. 15 capsule 0  . Multiple Vitamin (MULTIVITAMIN) tablet Take 1 tablet by mouth daily.       No current facility-administered medications on file prior to visit.     BP 122/72 (BP Location: Right Arm, Patient Position: Sitting, Cuff Size: Normal)   Temp 99 F (37.2 C) (Oral)   Ht 5\' 4"  (1.626 m)   Wt 118 lb 9.6 oz (53.8 kg)   BMI 20.36 kg/m       Objective:   Physical Exam  Constitutional: She is  oriented to person, place, and time.  Thin, optimally nourished female  Eyes: Pupils are equal, round, and reactive to light. No scleral icterus.  Neck: Neck supple.  Cardiovascular: Normal rate and regular rhythm.   Pulmonary/Chest: Effort normal and breath sounds normal. She has no wheezes. She has no rales.  Abdominal: There is no tenderness.  Musculoskeletal: She exhibits no edema.  No erythema, warmth noted on right upper extremity. Soft, rubbery spherical cyst approximately 0.5 cm without erythema, warmth, or tenderness to palpation   Lymphadenopathy:    She has no cervical adenopathy.  Neurological: She is alert and oriented to person, place, and time.  Coordination normal.  Skin: Skin is warm and dry. No rash noted.  Psychiatric: She has a normal mood and affect. Her behavior is normal. Judgment and thought content normal.       Assessment & Plan:  1. Attention deficit disorder (ADD) without hyperactivity Stable; continue regimen. [place] Substance Abuse Database checked. No concerning activity noted. - methylphenidate (RITALIN) 10 MG tablet; Take 1 tablet (10 mg total) by mouth 3 (three) times daily.  Dispense: 90 tablet; Refill: 0 - methylphenidate (RITALIN) 10 MG tablet; Take 1 tablet (10 mg total) by mouth 3 (three) times daily.  Dispense: 90 tablet; Refill: 0 - methylphenidate (RITALIN) 10 MG tablet; Take 1 tablet (10 mg total) by mouth 3 (three) times daily.  Dispense: 90 tablet; Refill: 0  2. Cellulitis of right upper extremity Improving; exam indicates resolving symptoms; advised completion of Clindamycin  3. Inflamed sebaceous cyst Improved; exam indicates decrease in size of inflamed cyst; advised completion of clindamycin   Patient was able to cool down after sitting in office with AC. Recheck of temp 98.0 F. Advised her to follow up if symptoms of cellulitis do not continue to improve or she experiences new symptoms.  Further advised her that she can use warm compresses and described symptoms to monitor for including increased redness, swelling or fever.  Follow up for ADD in 6 months or sooner if needed.  Roddie Mc, FNP-C

## 2017-05-26 NOTE — Patient Instructions (Signed)
Please continue antibiotic as prescribed and you can use warm compresses also. Follow up if symptoms do not continue to improve, worsen, or you notice new symptoms particularly increase in redness, warmth, or swelling.  Follow up in 6 months or sooner if needed.

## 2017-08-21 ENCOUNTER — Telehealth: Payer: Self-pay | Admitting: Family Medicine

## 2017-08-21 NOTE — Telephone Encounter (Signed)
Spoke with Dr Salomon FickBanks for Advise stated that pt requires to make an appointment to transfer in order for her to refill the Rx.

## 2017-08-21 NOTE — Telephone Encounter (Signed)
Needs approval. Thanks.

## 2017-08-21 NOTE — Telephone Encounter (Signed)
Please advise 

## 2017-08-21 NOTE — Telephone Encounter (Signed)
Copied from CRM [phone]. Topic: General - Other >> Aug 21, 2017  2:08 PM Leafy Roobinson, Norma J wrote: Reason for CRM: Pt is aware Amil Amenjulia is not longer at [facility]. Pt would like another rx for ritalin. Pt is aware she will have to make an appt with new provider. Pt said julia extended the request for her to get 6 month supply

## 2017-08-21 NOTE — Telephone Encounter (Signed)
Spoke with pt explained that Brenda Lyons is no longer at the practice and that she needed to make an app to transfer/establish with another provider in our practice to have her Rx managed better. Pt stated that she urgently need the medication since she has been out for a while. Pt requested to have someone from the office call her in the morning in regards to Rx refill.

## 2017-08-21 NOTE — Telephone Encounter (Signed)
Called pt left a message to return my call in the office regarding to her Rx request.

## 2017-08-21 NOTE — Telephone Encounter (Signed)
Copied from CRM [id number]. Topic: General - Other >> Aug 21, 2017  2:08 PM Leafy Roobinson, Norma J wrote: Reason for CRM: Pt is aware Amil Amenjulia is not longer at [facility]. Pt would like another rx for ritalin. Pt is aware she will have to make an appt with new provider. Pt said julia extended the request for her to get 6 month supply / LOV 05/26/2017 with Delbert HarnessJulie Kordsmeier / Is there another provider who will authorize this prescription as the patient did not want to schedule an appointment to see anyone else

## 2017-08-22 ENCOUNTER — Telehealth: Payer: Self-pay | Admitting: Family Medicine

## 2017-08-22 ENCOUNTER — Other Ambulatory Visit: Payer: Self-pay

## 2017-08-22 DIAGNOSIS — F988 Other specified behavioral and emotional disorders with onset usually occurring in childhood and adolescence: Secondary | ICD-10-CM

## 2017-08-22 MED ORDER — METHYLPHENIDATE HCL 10 MG PO TABS: 10.0000 mg | ORAL_TABLET | Freq: Three times a day (TID) | ORAL | 0 refills | Status: DC

## 2017-08-22 NOTE — Telephone Encounter (Signed)
Copied from CRM #[id number]. Topic: Quick Communication - See Telephone Encounter >> Aug 22, 2017  9:14 AM Inetta FermoHendricks, Jessica S, CMA wrote: CRM for notification. See Telephone encounter for: Patient needs to establish care with new provider at patients previous pcp office, ok for PEC to schedule and notify of message  08/21/17. >> Aug 22, 2017 12:46 PM Crist InfanteHarrald, Kathy J wrote: Pt states Amil AmenJulia advised ok for ADD fup in 6 months, and that was in August.  Pt has made appt with Kandee Keenory for January, but works 2 jobs, 7 days a week and cannot get off work. Used to see Dr Tawanna Coolerodd, and hopes Dr Tawanna Coolerodd will sign off on her ritalin for another 3 months. Amil AmenJulia did sign off on a 6 mo follow up at her last visit according to the AVS

## 2017-08-22 NOTE — Telephone Encounter (Signed)
Rx has been signed by Amil AmenJulia and a message has been left on pt voicemail to call office in regards to her Rx request

## 2017-08-22 NOTE — Telephone Encounter (Signed)
She will need to schedule with another provider. Patients were notified by letter in August 2018 to establish care with another provider. Advise that she set up an establish care appointment.

## 2017-08-22 NOTE — Telephone Encounter (Signed)
Left message to return call, ok for PEC to speak to patient and schedule her for a new provider at previous office. Not a [place] [facility] patient.Needs to be sent to office of patients pcp.

## 2017-08-22 NOTE — Telephone Encounter (Signed)
Spoke with pt voiced understanding that Brenda Lyons has Agreed to refill her Ritalin for 1 month. Advised pt to establish or transfer with another provider who can manage her Rx. Pt aware that she will be notified when the Rx is ready for pick up.

## 2017-08-22 NOTE — Telephone Encounter (Signed)
Pt is scheduled for an appointment with Kandee KeenCory to establish care in January 23rd.

## 2017-08-22 NOTE — Telephone Encounter (Signed)
Brenda HaileyDustin spoke with Brenda AmenJulia this morning , stated that she will offer pt a 30 day supply and pt will need to establish with another provider for more refills, Rx has been printed awaiting Brenda AmenJulia to come by the office to sign the Rx.

## 2017-08-22 NOTE — Telephone Encounter (Signed)
Rx has been signed by Amil AmenJulia and pt is aware to pick up Rx in the office

## 2017-08-22 NOTE — Telephone Encounter (Signed)
Pt is aware that Rx is ready for Pick up at the front desk.

## 2017-08-22 NOTE — Telephone Encounter (Signed)
Left message on voice mail for pt to call [facility]; scheduling appt; see CRM [id number].

## 2017-08-22 NOTE — Telephone Encounter (Signed)
[place] Substance Abuse Database Checked: No concerning findings. Provided one month refill and patient has been scheduled to establish with Shirline Freesory Nafziger

## 2017-08-23 NOTE — Telephone Encounter (Signed)
Spoke with pt voiced understanding that the Rx for Ritalin is ready for pick up in the office.

## 2017-09-01 ENCOUNTER — Telehealth: Payer: Self-pay

## 2017-09-01 NOTE — Telephone Encounter (Signed)
CRM was resolved

## 2017-10-12 NOTE — Telephone Encounter (Signed)
Pt called in to reschedule her apt to a sooner date (10/18/17) . Pt says that she is completely out of her Ritalin Medication. Pt would like to know if possible if someone could provide her with a few pills of her medication until her apt? Pt says that she just dont want to stop her medication cold [place].    Please advise.    CB: [phone]

## 2017-10-12 NOTE — Telephone Encounter (Signed)
FYI

## 2017-10-18 ENCOUNTER — Ambulatory Visit (INDEPENDENT_AMBULATORY_CARE_PROVIDER_SITE_OTHER): Payer: Self-pay | Admitting: Family Medicine

## 2017-10-18 ENCOUNTER — Encounter: Payer: Self-pay | Admitting: Family Medicine

## 2017-10-18 VITALS — BP 120/80 | HR 93 | Temp 98.9°F | Ht 65.75 in | Wt 128.9 lb

## 2017-10-18 DIAGNOSIS — L2389 Allergic contact dermatitis due to other agents: Secondary | ICD-10-CM

## 2017-10-18 DIAGNOSIS — F988 Other specified behavioral and emotional disorders with onset usually occurring in childhood and adolescence: Secondary | ICD-10-CM

## 2017-10-18 MED ORDER — METHYLPHENIDATE HCL 10 MG PO TABS: 10.0000 mg | ORAL_TABLET | Freq: Three times a day (TID) | ORAL | 0 refills | Status: AC

## 2017-10-18 MED ORDER — METHYLPHENIDATE HCL 10 MG PO TABS
10.0000 mg | ORAL_TABLET | Freq: Three times a day (TID) | ORAL | 0 refills | Status: DC
Start: 2017-10-18 — End: 2018-01-12

## 2017-10-18 MED ORDER — METHYLPHENIDATE HCL 10 MG PO TABS: 10.0000 mg | ORAL_TABLET | Freq: Three times a day (TID) | ORAL | 0 refills | Status: DC

## 2017-10-18 NOTE — Progress Notes (Signed)
Subjective:    Patient ID: Brenda Lyons, female    DOB: [birth year], 47 y.o.   MRN: [id number]  No chief complaint on file.   HPI Patient was seen today for f/u on ADD.  Pt states she is doing well.  She has been out of meds since Thursday, so endorses difficulty focusing at work.  Pt states her sleep is great and her mood is good.  Pt endorses a rash on her neck and upper chest, on and off x 1 month. 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  She typically gets a similar rash in the summer.  Pt has tried hydrocortisone which helps.  Pt tries to wear clothing to cover her skin, but states it is difficult.  Pt states she at times scratches at night.  Past Medical History:  Diagnosis Date  . ADD (attention deficit disorder)     Allergies  Allergen Reactions  . Bupropion Hcl     REACTION: rash  . Penicillins     REACTION: rash    ROS General: Denies fever, chills, night sweats, changes in weight, changes in appetite  +difficulty focusing. HEENT: Denies headaches, ear pain, changes in vision, rhinorrhea, sore throat CV: Denies CP, palpitations, SOB, orthopnea Pulm: Denies SOB, cough, wheezing GI: Denies abdominal pain, nausea, vomiting, diarrhea, constipation GU: Denies dysuria, hematuria, frequency, vaginal discharge Msk: Denies muscle cramps, joint pains Neuro: Denies weakness, numbness, tingling Skin: Denies bruising  +rash Psych: Denies depression, anxiety, hallucinations     Objective:    Blood pressure 120/80, pulse 93, temperature 98.9 F (37.2 C), temperature source Oral, height 5' 5.75" (1.67 m), weight 128 lb 14.4 oz (58.5 kg).   Gen. Pleasant, well-nourished, in no distress, normal affect   HEENT: /AT, face symmetric, conjunctiva clear, no scleral icterus, PERRLA, nares patent without drainage, pharynx without erythema or exudate. Lungs: no accessory muscle use, CTAB, no wheezes or rales Cardiovascular: RRR, no m/r/g, no  peripheral edema Abdomen: BS present, soft, NT/ND Neuro:  A&Ox3, CN II-XII intact, normal gait Skin:  Warm, dry, intact.  Upper L chest and R neck with erythematous plaques.   Wt Readings from Last 3 Encounters:  10/18/17 128 lb 14.4 oz (58.5 kg)  05/26/17 118 lb 9.6 oz (53.8 kg)  05/22/17 118 lb 6.4 oz (53.7 kg)    Lab Results  Component Value Date   WBC 6.6 11/25/2016   HGB 13.1 11/25/2016   HCT 39.1 11/25/2016   PLT 232.0 11/25/2016   GLUCOSE 97 11/25/2016   ALT 20 11/25/2016   AST 20 11/25/2016   NA 137 11/25/2016   K 4.2 11/25/2016   CL 106 11/25/2016   CREATININE 0.75 11/25/2016   BUN 15 11/25/2016   CO2 27 11/25/2016   TSH 0.79 04/08/2013    Assessment/Plan:  Attention deficit disorder (ADD) without hyperactivity  -Adult symptoms checklist done.  -will continue ritalin 10 mg TID.  Given rx for 3 months. - Plan: methylphenidate (RITALIN) 10 MG tablet, methylphenidate (RITALIN) 10 MG tablet, methylphenidate (RITALIN) 10 MG tablet  Allergic contact dermatitis due to other agents -likely 2/2 contact with hay as pt endorses similar rash in the summer. -discussed keeping area clean and dry.  Ok to use cortisone cream sparingly. -Discussed wearing clothing to limit skin contact.   F/u in 6 months.  Abbe AmsterdamShannon Banks, MD

## 2017-10-18 NOTE — Patient Instructions (Addendum)
Contact Dermatitis Dermatitis is redness, soreness, and swelling (inflammation) of the skin. Contact dermatitis is a reaction to certain substances that touch the skin. You either touched something that irritated your skin, or you have allergies to something you touched. Follow these instructions at home: Skin Care  Moisturize your skin as needed.  Apply cool compresses to the affected areas.  Try taking a bath with: ? Epsom salts. Follow the instructions on the package. You can get these at a pharmacy or grocery store. ? Baking soda. Pour a small amount into the bath as told by your doctor. ? Colloidal oatmeal. Follow the instructions on the package. You can get this at a pharmacy or grocery store.  Try applying baking soda paste to your skin. Stir water into baking soda until it looks like paste.  Do not scratch your skin.  Bathe less often.  Bathe in lukewarm water. Avoid using hot water. Medicines  Take or apply over-the-counter and prescription medicines only as told by your doctor.  If you were prescribed an antibiotic medicine, take or apply your antibiotic as told by your doctor. Do not stop taking the antibiotic even if your condition starts to get better. General instructions  Keep all follow-up visits as told by your doctor. This is important.  Avoid the substance that caused your reaction. If you do not know what caused it, keep a journal to try to track what caused it. Write down: ? What you eat. ? What cosmetic products you use. ? What you drink. ? What you wear in the affected area. This includes jewelry.  If you were given a bandage (dressing), take care of it as told by your doctor. This includes when to change and remove it. Contact a doctor if:  You do not get better with treatment.  Your condition gets worse.  You have signs of infection such as: ? Swelling. ? Tenderness. ? Redness. ? Soreness. ? Warmth.  You have a fever.  You have new  symptoms. Get help right away if:  You have a very bad headache.  You have neck pain.  Your neck is stiff.  You throw up (vomit).  You feel very sleepy.  You see red streaks coming from the affected area.  Your bone or joint underneath the affected area becomes painful after the skin has healed.  The affected area turns darker.  You have trouble breathing. This information is not intended to replace advice given to you by your health care provider. Make sure you discuss any questions you have with your health care provider. Document Released: 07/17/2009 Document Revised: 02/25/2016 Document Reviewed: 02/04/2015 Elsevier Interactive Patient Education  2018 Elsevier Inc.  

## 2017-10-25 ENCOUNTER — Ambulatory Visit: Payer: Self-pay | Admitting: Adult Health

## 2018-01-08 ENCOUNTER — Telehealth: Payer: Self-pay | Admitting: Family Medicine

## 2018-01-08 NOTE — Telephone Encounter (Signed)
Copied from CRM [phone]. Topic: Quick Communication - Rx Refill/Question >> Jan 08, 2018  2:27 PM Diana EvesHoyt, Maryann B wrote: Medication: methylphenidate (RITALIN) 10 MG tablet   Pt will need it refilled on 01/20/18   Agent: Please be advised that RX refills may take up to 3 business days. We ask that you follow-up with your pharmacy.

## 2018-01-12 ENCOUNTER — Other Ambulatory Visit: Payer: Self-pay | Admitting: Family Medicine

## 2018-01-12 DIAGNOSIS — F988 Other specified behavioral and emotional disorders with onset usually occurring in childhood and adolescence: Secondary | ICD-10-CM

## 2018-01-12 MED ORDER — METHYLPHENIDATE HCL 10 MG PO TABS: 10.0000 mg | ORAL_TABLET | Freq: Three times a day (TID) | ORAL | 0 refills | Status: AC

## 2018-01-12 NOTE — Progress Notes (Signed)
Ritalin 10 mg TID refilled. 3 months supply sent to pt's pharmacy.

## 2018-01-12 NOTE — Telephone Encounter (Signed)
Done

## 2018-01-12 NOTE — Telephone Encounter (Signed)
Ok to refilled medication? Please advise 

## 2018-04-26 ENCOUNTER — Other Ambulatory Visit: Payer: Self-pay | Admitting: Family Medicine

## 2018-04-26 DIAGNOSIS — F988 Other specified behavioral and emotional disorders with onset usually occurring in childhood and adolescence: Secondary | ICD-10-CM

## 2018-04-26 NOTE — Telephone Encounter (Signed)
Copied from CRM [phone]. Topic: Quick Communication - See Telephone Encounter >> Apr 26, 2018  2:02 PM Tamela OddiMartin, Don'Quashia, NT wrote: CRM for notification. See Telephone encounter for: 04/26/18. Patient called and states she needs a refill of her methylphenidate (RITALIN) 10 MG tablet  [facility] [id number] - [place], [place] - [address] [facility] [address] [phone] (Phone) [phone] (Fax)

## 2018-04-27 ENCOUNTER — Telehealth: Payer: Self-pay

## 2018-04-27 MED ORDER — METHYLPHENIDATE HCL 10 MG PO TABS: 10.0000 mg | ORAL_TABLET | Freq: Three times a day (TID) | ORAL | 0 refills | Status: AC

## 2018-04-27 NOTE — Telephone Encounter (Signed)
Ritalin  LRF  01/12/18   #90  0 refills  LOV   10/18/17 Dr. Francesca OmanBanks  [facility] [id number] - [place], [place] - [address] [facility] [address]      [phone] (Phone) [phone] (Fax)

## 2018-04-27 NOTE — Telephone Encounter (Signed)
Spoke with pt voiced understanding that her Rx for Ritalin is ready for pick up at the front office

## 2018-04-27 NOTE — Telephone Encounter (Signed)
Please Advise

## 2018-06-22 ENCOUNTER — Telehealth: Payer: Self-pay | Admitting: Family Medicine

## 2018-06-22 NOTE — Telephone Encounter (Signed)
Copied from CRM [id number]. Topic: Quick Communication - Rx Refill/Question >> Jun 22, 2018  3:30 PM Luanna ColeDawoud, Jessica L wrote: Medication: methylphenidate (RITALIN) 10 MG tablet [[id number]][[id number]]   Has the patient contacted their pharmacy? no (Agent: If no, request that the patient contact the pharmacy for the refill.) (Agent: If yes, when and what did the pharmacy advise?)  Preferred Pharmacy (with phone number or street name):[id number] - [place], [place] - [address] AT [facility] [address] [phone] (Phone) [phone] (Fax)   Agent: Please be advised that RX refills may take up to 3 business days. We ask that you follow-up with your pharmacy.

## 2018-06-27 NOTE — Telephone Encounter (Signed)
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

## 2018-06-27 NOTE — Telephone Encounter (Signed)
Noted  

## 2020-01-23 ENCOUNTER — Ambulatory Visit: Payer: Self-pay

## 2020-01-25 ENCOUNTER — Ambulatory Visit: Payer: Self-pay | Attending: Internal Medicine

## 2020-01-25 DIAGNOSIS — Z23 Encounter for immunization: Secondary | ICD-10-CM

## 2020-01-25 NOTE — Progress Notes (Signed)
   Covid-19 Vaccination [facility]  Name:  Brenda Lyons    MRN: [id number] DOB: Feb 13, 1971  01/25/2020  Brenda Lyons was observed post Covid-19 immunization for 15 minutes without incident. She was provided with Vaccine Information Sheet and instruction to access the V-Safe system.   Brenda Lyons was instructed to call 911 with any severe reactions post vaccine: Marland Kitchen Difficulty breathing  . Swelling of face and throat  . A fast heartbeat  . A bad rash all over body  . Dizziness and weakness   Immunizations Administered    Name Date Dose VIS Date Route   Pfizer COVID-19 Vaccine 01/25/2020  8:17 AM 0.3 mL 11/27/2018 Intramuscular   Manufacturer: [employer], [employer]   Lot: [id number]   NDC: [address]-8

## 2020-02-17 ENCOUNTER — Ambulatory Visit: Payer: Self-pay | Attending: Internal Medicine
